# Patient Record
Sex: Male | Born: 1960 | Race: White | Hispanic: No | Marital: Married | Smoking: Former smoker
Health system: Southern US, Community
[De-identification: ages and names within clinical notes are randomized; demographics above are authoritative.]

## PROBLEM LIST (undated history)

## (undated) DIAGNOSIS — F411 Generalized anxiety disorder: Secondary | ICD-10-CM

## (undated) HISTORY — DX: Generalized anxiety disorder: F41.1

## (undated) HISTORY — PX: TONSILLECTOMY AND ADENOIDECTOMY: SUR1326

---

## 2006-06-01 ENCOUNTER — Encounter: Payer: Self-pay | Admitting: Family Medicine

## 2011-02-07 ENCOUNTER — Other Ambulatory Visit: Payer: Self-pay | Admitting: *Deleted

## 2011-02-07 MED ORDER — ALPRAZOLAM 0.5 MG PO TABS: ORAL_TABLET | ORAL | Status: DC

## 2011-03-29 ENCOUNTER — Other Ambulatory Visit: Payer: Self-pay | Admitting: Internal Medicine

## 2011-06-28 ENCOUNTER — Telehealth: Payer: Self-pay | Admitting: *Deleted

## 2011-06-28 NOTE — Telephone Encounter (Signed)
Pharm faxed RF request -  XANAX 0.5 mg 1-2 tab qd prn. Pt has no existing apts. Please advise.

## 2011-06-28 NOTE — Telephone Encounter (Signed)
Needs to be seen. May give 1-2 week refill until seen. Also need to get records from [facility]

## 2011-07-03 MED ORDER — ALPRAZOLAM 0.5 MG PO TABS: ORAL_TABLET | ORAL | Status: DC

## 2011-07-03 NOTE — Telephone Encounter (Signed)
Called in [place], [place] or [place] [place], can you see if you can get pt in for OV (f/u, med RF)?

## 2011-07-05 NOTE — Telephone Encounter (Signed)
Attempted to reach patient to schedule a follow up visit the number we have on file is for [facility] and [employer] and no one would pick up it prompted you to leave a voice mail.

## 2011-07-10 NOTE — Telephone Encounter (Signed)
Called number [employer] answering machine picked up.  Left message message for them to call office.didn't not leave name of pt

## 2011-07-19 NOTE — Telephone Encounter (Signed)
Phone number is to [employer] answering machine picked up.  Left message for them to call office didn't leave name of pt.

## 2011-07-20 ENCOUNTER — Telehealth: Payer: Self-pay | Admitting: Internal Medicine

## 2011-07-20 NOTE — Telephone Encounter (Signed)
Pt called back he will call next week to schedule this appointment.  Pt also stated that he was a dr Darrick Huntsman patient not dr walker

## 2011-07-20 NOTE — Telephone Encounter (Signed)
Original RF request from pharm had Dr Tilman Neat name. OK to schedule w/Dr Darrick Huntsman.

## 2013-03-04 ENCOUNTER — Ambulatory Visit: Payer: Self-pay | Admitting: Family Medicine

## 2013-09-01 ENCOUNTER — Ambulatory Visit: Payer: Self-pay | Admitting: Cardiovascular Disease

## 2013-09-11 ENCOUNTER — Encounter: Payer: Self-pay | Admitting: *Deleted

## 2013-09-15 ENCOUNTER — Ambulatory Visit: Payer: Self-pay | Admitting: Cardiovascular Disease

## 2013-09-30 ENCOUNTER — Ambulatory Visit: Payer: Self-pay | Admitting: Cardiovascular Disease

## 2013-10-28 ENCOUNTER — Encounter (INDEPENDENT_AMBULATORY_CARE_PROVIDER_SITE_OTHER): Payer: Self-pay

## 2013-10-28 ENCOUNTER — Encounter: Payer: Self-pay | Admitting: Cardiovascular Disease

## 2013-10-28 ENCOUNTER — Ambulatory Visit (INDEPENDENT_AMBULATORY_CARE_PROVIDER_SITE_OTHER): Payer: BC Managed Care – PPO | Admitting: Cardiovascular Disease

## 2013-10-28 VITALS — BP 140/108 | HR 81 | Ht 76.0 in | Wt 260.5 lb

## 2013-10-28 DIAGNOSIS — R002 Palpitations: Secondary | ICD-10-CM

## 2013-10-28 DIAGNOSIS — R0602 Shortness of breath: Secondary | ICD-10-CM | POA: Insufficient documentation

## 2013-10-28 NOTE — Assessment & Plan Note (Signed)
The episode of palpitations with associated with significant dyspnea. Thus, I recommend a stress echocardiogram for evaluation and making sure he has no structural abnormalities.

## 2013-10-28 NOTE — Progress Notes (Signed)
Primary care physician: Dr. Juanetta Gosling  HPI   This is a pleasant 53 year old man who was referred for evaluation of palpitations and shortness of breath. He has no previous cardiac history. He has known history of anxiety. He takes metoprolol before public speaking if he is nervous. He has no history of hypertension or diabetes. Recent labs did show mild hyperlipidemia. He had episode of palpitations about 2-1/2 months ago when he was in [place]. He had a sudden episode of fast heartbeats associated with shortness of breath, dizziness and presyncope. The episode lasted for about 10 minutes. He went inside his car and turned the air conditioning on. He gradually felt better. He denies any chest discomfort. He reports 2 previous similar episodes which lasted for a short duration and were less intense. These episodes are usually at least a month or 2 apart. He had recent routine labs over all were unremarkable with normal thyroid function. He has no family history of premature coronary artery disease, arrhythmia or sudden death. His mother did have myocardial infarction in her early 26s. He works in [employer]. He smokes cigars socially on weekends and denies excessive alcohol use.  No Known Allergies   Current Outpatient Prescriptions on File Prior to Visit  Medication Sig Dispense Refill  . metoprolol tartrate (LOPRESSOR) 25 MG tablet Take 25 mg by mouth 2 (two) times daily as needed.      . sertraline (ZOLOFT) 100 MG tablet Take 100 mg by mouth daily.       No current facility-administered medications on file prior to visit.     Past Medical History  Diagnosis Date  . Anxiety state, unspecified      Past Surgical History  Procedure Laterality Date  . Tonsillectomy and adenoidectomy       Family History  Problem Relation Age of Onset  . Heart disease Mother   . Heart attack Mother   . Hypertension Mother      History   Social History  . Marital Status: Married   Spouse Name: N/A    Number of Children: N/A  . Years of Education: N/A   Occupational History  . Not on file.   Social History Main Topics  . Smoking status: Current Some Day Smoker    Types: Cigars  . Smokeless tobacco: Not on file  . Alcohol Use: Yes     Comment: occasional  . Drug Use: No  . Sexual Activity: Not on file   Other Topics Concern  . Not on file   Social History Narrative  . No narrative on file     ROS A 10 point review of system was performed. It is negative other than that mentioned in the history of present illness.   PHYSICAL EXAM   BP 140/108  Pulse 81  Ht 6\' 4"  (1.93 m)  Wt 260 lb 8 oz (118.162 kg)  BMI 31.72 kg/m2 Constitutional: He is oriented to person, place, and time. He appears well-developed and well-nourished. No distress.  HENT: No nasal discharge.  Head: Normocephalic and atraumatic.  Eyes: Pupils are equal and round.  No discharge. Neck: Normal range of motion. Neck supple. No JVD present. No thyromegaly present.  Cardiovascular: Normal rate, regular rhythm, normal heart sounds. Exam reveals no gallop and no friction rub. No murmur heard.  Pulmonary/Chest: Effort normal and breath sounds normal. No stridor. No respiratory distress. He has no wheezes. He has no rales. He exhibits no tenderness.  Abdominal: Soft. Bowel sounds are normal. He  exhibits no distension. There is no tenderness. There is no rebound and no guarding.  Musculoskeletal: Normal range of motion. He exhibits no edema and no tenderness.  Neurological: He is alert and oriented to person, place, and time. Coordination normal.  Skin: Skin is warm and dry. No rash noted. He is not diaphoretic. No erythema. No pallor.  Psychiatric: He has a normal mood and affect. His behavior is normal. Judgment and thought content normal.       EKG: Normal sinus rhythm with no significant ST or T wave changes.   ASSESSMENT AND PLAN

## 2013-10-28 NOTE — Assessment & Plan Note (Signed)
Some of his symptoms are suggestive of paroxysmal supraventricular tachycardia. However, these episodes are not happening frequently enough to be captured with routine monitoring. I instructed him to monitor his symptoms. I coached him on how to check his own pulse. If these episodes become more frequent, I will consider a 30 day outpatient telemetry. Alternative would be a loop recorder.

## 2013-10-28 NOTE — Patient Instructions (Signed)
Your physician has requested that you have a stress echocardiogram. For further information please visit [email or web address] Please follow instruction sheet as given.  Do not take metoprolol before your stress test   Your physician recommends that you schedule a follow-up appointment in:  As needed

## 2013-11-19 ENCOUNTER — Telehealth: Payer: Self-pay

## 2013-11-19 NOTE — Telephone Encounter (Signed)
l mom to schedule echo stress. Pt did not schedule at checkout

## 2013-12-09 ENCOUNTER — Other Ambulatory Visit (INDEPENDENT_AMBULATORY_CARE_PROVIDER_SITE_OTHER): Payer: BC Managed Care – PPO

## 2013-12-09 DIAGNOSIS — R0602 Shortness of breath: Secondary | ICD-10-CM

## 2013-12-10 ENCOUNTER — Telehealth: Payer: Self-pay

## 2013-12-10 NOTE — Telephone Encounter (Signed)
Pt would like stress test results.  

## 2013-12-10 NOTE — Telephone Encounter (Signed)
Left patient vm to let him know that results are being reviewed and we will call him in 24-48 hrs with results

## 2013-12-11 ENCOUNTER — Telehealth: Payer: Self-pay | Admitting: *Deleted

## 2013-12-11 NOTE — Telephone Encounter (Signed)
Patient called wanting echo results. 

## 2013-12-12 ENCOUNTER — Telehealth: Payer: Self-pay | Admitting: *Deleted

## 2013-12-12 NOTE — Telephone Encounter (Signed)
error 

## 2013-12-12 NOTE — Telephone Encounter (Signed)
See result note.  

## 2014-11-29 DIAGNOSIS — F411 Generalized anxiety disorder: Secondary | ICD-10-CM | POA: Insufficient documentation

## 2014-12-31 ENCOUNTER — Other Ambulatory Visit: Payer: Self-pay | Admitting: Student

## 2014-12-31 DIAGNOSIS — M542 Cervicalgia: Secondary | ICD-10-CM

## 2014-12-31 DIAGNOSIS — M5412 Radiculopathy, cervical region: Secondary | ICD-10-CM

## 2015-01-07 ENCOUNTER — Ambulatory Visit: Payer: Self-pay

## 2015-07-12 ENCOUNTER — Other Ambulatory Visit: Payer: Self-pay | Admitting: Family Medicine

## 2015-09-28 DIAGNOSIS — R0781 Pleurodynia: Secondary | ICD-10-CM | POA: Diagnosis not present

## 2015-11-30 ENCOUNTER — Telehealth: Payer: Self-pay | Admitting: Family Medicine

## 2015-11-30 ENCOUNTER — Other Ambulatory Visit: Payer: Self-pay | Admitting: Family Medicine

## 2015-11-30 MED ORDER — SILDENAFIL CITRATE 50 MG PO TABS: 50.0000 mg | ORAL_TABLET | ORAL | Status: DC | PRN

## 2015-11-30 NOTE — Telephone Encounter (Signed)
Pt called requesting a refill on Viagra 50 mg. P call back # is [phone]

## 2015-11-30 NOTE — Telephone Encounter (Signed)
Done.  Needs appt. Before getting more.-jh

## 2015-12-31 ENCOUNTER — Encounter: Payer: Self-pay | Admitting: *Deleted

## 2015-12-31 DIAGNOSIS — N200 Calculus of kidney: Secondary | ICD-10-CM | POA: Insufficient documentation

## 2015-12-31 DIAGNOSIS — N529 Male erectile dysfunction, unspecified: Secondary | ICD-10-CM | POA: Insufficient documentation

## 2016-01-03 ENCOUNTER — Encounter: Payer: Self-pay | Admitting: Family Medicine

## 2016-01-03 ENCOUNTER — Ambulatory Visit (INDEPENDENT_AMBULATORY_CARE_PROVIDER_SITE_OTHER): Payer: BLUE CROSS/BLUE SHIELD | Admitting: Family Medicine

## 2016-01-03 VITALS — BP 140/80 | HR 72 | Temp 97.7°F | Ht 74.5 in | Wt 248.0 lb

## 2016-01-03 DIAGNOSIS — R002 Palpitations: Secondary | ICD-10-CM | POA: Diagnosis not present

## 2016-01-03 DIAGNOSIS — F419 Anxiety disorder, unspecified: Secondary | ICD-10-CM | POA: Diagnosis not present

## 2016-01-03 DIAGNOSIS — F329 Major depressive disorder, single episode, unspecified: Secondary | ICD-10-CM

## 2016-01-03 DIAGNOSIS — F32A Depression, unspecified: Secondary | ICD-10-CM

## 2016-01-03 MED ORDER — METOPROLOL TARTRATE 25 MG PO TABS: ORAL_TABLET | ORAL | 6 refills | Status: DC

## 2016-01-03 NOTE — Progress Notes (Signed)
Name: Todd Crawford   MRN: [id number]    DOB: Nov 17, 1960   Date:01/03/2016       Progress Note  Subjective  Chief Complaint  Chief Complaint  Patient presents with  . Medication Refill    HPI Here after absence of 17 months.  Has hx of anxiety and depression.  Alos with hx. Of palpitations.  Had been on SSRI and B blocker on past, but has been off both for > 6 months.  Had neck disc surgery 05/2016.  He has recovered well from surgery.  Anxiety and depressive sx are returning, esp. With meetings with groups of people.  Anxiety with these meetings.  He has had no major problems with palpitations recently, but B blocker elped his anxiety at meetings. He felt more focused on tasks while on Zoloft.  Starting to have that problem  again.  No problem-specific Assessment & Plan notes found for this encounter.   Past Medical History:  Diagnosis Date  . Anxiety state, unspecified     Past Surgical History:  Procedure Laterality Date  . TONSILLECTOMY AND ADENOIDECTOMY      Family History  Problem Relation Age of Onset  . Heart disease Mother   . Heart attack Mother   . Hypertension Mother     Social History   Social History  . Marital status: Married    Spouse name: N/A  . Number of children: N/A  . Years of education: N/A   Occupational History  . Not on file.   Social History Main Topics  . Smoking status: Current Some Day Smoker    Types: Cigars  . Smokeless tobacco: Never Used  . Alcohol use Yes     Comment: occasional  . Drug use: No  . Sexual activity: Not on file   Other Topics Concern  . Not on file   Social History Narrative  . No narrative on file     Current Outpatient Prescriptions:  .  metoprolol tartrate (LOPRESSOR) 25 MG tablet, Take 25 mg by mouth 2 (two) times daily as needed., Disp: , Rfl:  .  sertraline (ZOLOFT) 100 MG tablet, Take 100 mg by mouth daily., Disp: , Rfl:  .  sildenafil (VIAGRA) 50 MG tablet, Take 1 tablet (50 mg total) by  mouth as needed for erectile dysfunction. Office appointment needed to get more refills (Patient not taking: Reported on 01/03/2016), Disp: 6 tablet, Rfl: 0  Allergies  Allergen Reactions  . Tramadol Nausea Only     Review of Systems  Constitutional: Negative for chills, fever, malaise/fatigue and weight loss.  HENT: Negative for hearing loss.   Eyes: Negative for blurred vision and double vision.  Respiratory: Negative for cough, shortness of breath and wheezing.   Cardiovascular: Positive for palpitations (rare). Negative for chest pain and leg swelling.  Gastrointestinal: Negative for abdominal pain, blood in stool and heartburn.  Genitourinary: Negative for dysuria, frequency and urgency.  Musculoskeletal: Negative for back pain, myalgias and neck pain.  Skin: Negative for rash.  Neurological: Negative for dizziness, tremors, weakness and headaches.  Psychiatric/Behavioral: Positive for depression (mild). The patient is nervous/anxious (mild).       Objective  Vitals:   01/03/16 0842  BP: (!) 149/92  Pulse: 69  Temp: 97.7 F (36.5 C)  TempSrc: Oral  Weight: 248 lb (112.5 kg)  Height: 6' 2.5" (1.892 m)    Physical Exam  Constitutional: He is oriented to person, place, and time and well-developed, well-nourished, and in no distress.  No distress.  HENT:  Head: Normocephalic and atraumatic.  Eyes: Conjunctivae and EOM are normal. Pupils are equal, round, and reactive to light. No scleral icterus.  Neck: Normal range of motion. Neck supple. Carotid bruit is not present. No thyromegaly present.  Cardiovascular: Normal rate, regular rhythm and normal heart sounds.  Exam reveals no gallop and no friction rub.   No murmur heard. Pulmonary/Chest: Effort normal and breath sounds normal. No respiratory distress. He has no wheezes. He has no rales.  Abdominal: Soft. He exhibits no distension and no mass. There is no tenderness.  Musculoskeletal: He exhibits no edema.   Lymphadenopathy:    He has no cervical adenopathy.  Neurological: He is alert and oriented to person, place, and time.  Psychiatric:  PHQ-9 score of 2  Vitals reviewed.      No results found for this or any previous visit (from the past 2160 hour(s)).   Assessment & Plan  Problem List Items Addressed This Visit    None    Visit Diagnoses   None.     Meds ordered this encounter  Medications  . DISCONTD: etodolac (LODINE) 500 MG tablet    Sig: Take 500 mg by mouth 2 (two) times daily.  Marland Kitchen DISCONTD: acetaminophen (TYLENOL) 500 MG tablet    Sig: Take 500 mg by mouth as needed.  Marland Kitchen DISCONTD: HYDROcodone-acetaminophen (NORCO/VICODIN) 5-325 MG tablet    Sig: Take 1 tablet by mouth daily as needed.  Marland Kitchen DISCONTD: ketorolac (TORADOL) 10 MG tablet    Sig: Take 1 tablet by mouth daily as needed.    Refill:  0   1. Anxiety  - metoprolol tartrate (LOPRESSOR) 25 MG tablet; Take 1/2 tablet twice daily  Dispense: 30 tablet; Refill: 6  2. Depression   3. Heart palpitations  - metoprolol tartrate (LOPRESSOR) 25 MG tablet; Take 1/2 tablet twice daily  Dispense: 30 tablet; Refill: 6

## 2016-02-21 ENCOUNTER — Ambulatory Visit (INDEPENDENT_AMBULATORY_CARE_PROVIDER_SITE_OTHER): Payer: BLUE CROSS/BLUE SHIELD | Admitting: Family Medicine

## 2016-02-21 ENCOUNTER — Encounter: Payer: Self-pay | Admitting: Family Medicine

## 2016-02-21 VITALS — BP 125/81 | HR 67 | Temp 97.9°F | Resp 16 | Ht 74.5 in | Wt 251.0 lb

## 2016-02-21 DIAGNOSIS — R002 Palpitations: Secondary | ICD-10-CM

## 2016-02-21 DIAGNOSIS — N528 Other male erectile dysfunction: Secondary | ICD-10-CM | POA: Diagnosis not present

## 2016-02-21 DIAGNOSIS — F419 Anxiety disorder, unspecified: Secondary | ICD-10-CM | POA: Diagnosis not present

## 2016-02-21 DIAGNOSIS — I10 Essential (primary) hypertension: Secondary | ICD-10-CM

## 2016-02-21 DIAGNOSIS — F329 Major depressive disorder, single episode, unspecified: Secondary | ICD-10-CM | POA: Diagnosis not present

## 2016-02-21 DIAGNOSIS — F32A Depression, unspecified: Secondary | ICD-10-CM

## 2016-02-21 MED ORDER — METOPROLOL TARTRATE 25 MG PO TABS: ORAL_TABLET | ORAL | 3 refills | Status: DC

## 2016-02-21 MED ORDER — SERTRALINE HCL 50 MG PO TABS: 50.0000 mg | ORAL_TABLET | Freq: Every day | ORAL | 6 refills | Status: DC

## 2016-02-21 NOTE — Patient Instructions (Signed)
I have refilled Sildenafil (generic). at 20 mg to be used for ED; 1-5 tabs as needed before sexual activity.

## 2016-02-21 NOTE — Progress Notes (Signed)
Name: Todd Crawford   MRN: [id number]    DOB: [birth year]   Date:02/21/2016       Progress Note  Subjective  Chief Complaint  Chief Complaint  Patient presents with  . Anxiety  . Hypertension    HPI Here for f/u of HBP and anxiety.  BP doing well, but having some ewxtra anxiety ans some depressive sx. returning like poor sleep, decreased motivation and increased anxiety.  He did well on Zoloft in the past and would like to restart it. He would like viagra refilled.  No problem-specific Assessment & Plan notes found for this encounter.   Past Medical History:  Diagnosis Date  . Anxiety state, unspecified     Past Surgical History:  Procedure Laterality Date  . TONSILLECTOMY AND ADENOIDECTOMY      Family History  Problem Relation Age of Onset  . Heart disease Mother   . Heart attack Mother   . Hypertension Mother     Social History   Social History  . Marital status: Married    Spouse name: N/A  . Number of children: N/A  . Years of education: N/A   Occupational History  . Not on file.   Social History Main Topics  . Smoking status: Current Some Day Smoker    Types: Cigars  . Smokeless tobacco: Never Used  . Alcohol use Yes     Comment: occasional  . Drug use: No  . Sexual activity: Not on file   Other Topics Concern  . Not on file   Social History Narrative  . No narrative on file     Current Outpatient Prescriptions:  .  metoprolol tartrate (LOPRESSOR) 25 MG tablet, Take 1/2 tablet twice daily, Disp: 90 tablet, Rfl: 3 .  sildenafil (VIAGRA) 50 MG tablet, Take 1 tablet (50 mg total) by mouth as needed for erectile dysfunction. Office appointment needed to get more refills, Disp: 6 tablet, Rfl: 0 .  sertraline (ZOLOFT) 50 MG tablet, Take 1 tablet (50 mg total) by mouth daily., Disp: 30 tablet, Rfl: 6  Allergies  Allergen Reactions  . Tramadol Nausea Only     Review of Systems  Constitutional: Positive for malaise/fatigue. Negative for  chills, fever and weight loss.  HENT: Negative for hearing loss.   Eyes: Negative for blurred vision and double vision.  Respiratory: Negative for cough, shortness of breath and wheezing.   Cardiovascular: Negative for chest pain, palpitations and leg swelling.  Gastrointestinal: Negative for abdominal pain, blood in stool and heartburn.  Genitourinary: Negative for dysuria, frequency and urgency.  Musculoskeletal: Negative for joint pain and myalgias.  Skin: Negative for rash.  Neurological: Negative for dizziness, tremors, weakness and headaches.  Psychiatric/Behavioral: Positive for depression. The patient is nervous/anxious and has insomnia.       Objective  Vitals:   02/21/16 0835  BP: 125/81  Pulse: 67  Resp: 16  Temp: 97.9 F (36.6 C)  TempSrc: Oral  Weight: 251 lb (113.9 kg)  Height: 6' 2.5" (1.892 m)    Physical Exam  Constitutional: He is oriented to person, place, and time and well-developed, well-nourished, and in no distress. No distress.  HENT:  Head: Normocephalic and atraumatic.  Eyes: Conjunctivae and EOM are normal. Pupils are equal, round, and reactive to light.  Neck: Normal range of motion. Neck supple. No thyromegaly present.  Cardiovascular: Normal rate, regular rhythm and normal heart sounds.  Exam reveals no gallop and no friction rub.   No murmur heard. Pulmonary/Chest: Effort  normal. No respiratory distress. He has no wheezes. He has no rales. He exhibits no tenderness.  Abdominal: Soft. Bowel sounds are normal. He exhibits no distension and no mass. There is no tenderness. There is no rebound and no guarding.  Musculoskeletal: He exhibits no edema.  Lymphadenopathy:    He has no cervical adenopathy.  Neurological: He is alert and oriented to person, place, and time.  Psychiatric:  Affect is mildly depressed and anxious  Vitals reviewed.      No results found for this or any previous visit (from the past 2160 hour(s)).   Assessment &  Plan  Problem List Items Addressed This Visit      Cardiovascular and Mediastinum   HBP (high blood pressure)   Relevant Medications   metoprolol tartrate (LOPRESSOR) 25 MG tablet     Genitourinary   ED (erectile dysfunction) - Primary     Other   Heart palpitations   Relevant Medications   metoprolol tartrate (LOPRESSOR) 25 MG tablet   Depression   Relevant Medications   sertraline (ZOLOFT) 50 MG tablet   Anxiety   Relevant Medications   sertraline (ZOLOFT) 50 MG tablet    Other Visit Diagnoses   None.     Meds ordered this encounter  Medications  . metoprolol tartrate (LOPRESSOR) 25 MG tablet    Sig: Take 1/2 tablet twice daily    Dispense:  90 tablet    Refill:  3  . sertraline (ZOLOFT) 50 MG tablet    Sig: Take 1 tablet (50 mg total) by mouth daily.    Dispense:  30 tablet    Refill:  6   1. Anxiety  - sertraline (ZOLOFT) 50 MG tablet; Take 1 tablet (50 mg total) by mouth daily.  Dispense: 30 tablet; Refill: 6  2. Heart palpitations  - metoprolol tartrate (LOPRESSOR) 25 MG tablet; Take 1/2 tablet twice daily  Dispense: 90 tablet; Refill: 3  3. Other male erectile dysfunction Refilled generic Sildenafil 20 mg for ED, 1-5 prn.  #100 with 5 refills.  4. Essential hypertension  - metoprolol tartrate (LOPRESSOR) 25 MG tablet; Take 1/2 tablet twice daily  Dispense: 90 tablet; Refill: 3  5. Depression  - sertraline (ZOLOFT) 50 MG tablet; Take 1 tablet (50 mg total) by mouth daily.  Dispense: 30 tablet; Refill: 6

## 2016-04-03 ENCOUNTER — Ambulatory Visit: Payer: BLUE CROSS/BLUE SHIELD | Admitting: Family Medicine

## 2016-06-07 ENCOUNTER — Ambulatory Visit (INDEPENDENT_AMBULATORY_CARE_PROVIDER_SITE_OTHER): Payer: BLUE CROSS/BLUE SHIELD | Admitting: Family Medicine

## 2016-06-07 ENCOUNTER — Ambulatory Visit
Admission: RE | Admit: 2016-06-07 | Discharge: 2016-06-07 | Disposition: A | Discharge status: Home / Self Care | Payer: BLUE CROSS/BLUE SHIELD | Source: Ambulatory Visit | Attending: Family Medicine | Admitting: Family Medicine

## 2016-06-07 ENCOUNTER — Encounter: Payer: Self-pay | Admitting: Family Medicine

## 2016-06-07 DIAGNOSIS — M47896 Other spondylosis, lumbar region: Secondary | ICD-10-CM | POA: Diagnosis not present

## 2016-06-07 DIAGNOSIS — I700 Atherosclerosis of aorta: Secondary | ICD-10-CM | POA: Diagnosis not present

## 2016-06-07 DIAGNOSIS — M5432 Sciatica, left side: Secondary | ICD-10-CM | POA: Diagnosis not present

## 2016-06-07 DIAGNOSIS — M47897 Other spondylosis, lumbosacral region: Secondary | ICD-10-CM | POA: Insufficient documentation

## 2016-06-07 DIAGNOSIS — M4316 Spondylolisthesis, lumbar region: Secondary | ICD-10-CM | POA: Diagnosis not present

## 2016-06-07 DIAGNOSIS — M1612 Unilateral primary osteoarthritis, left hip: Secondary | ICD-10-CM | POA: Diagnosis not present

## 2016-06-07 DIAGNOSIS — M47816 Spondylosis without myelopathy or radiculopathy, lumbar region: Secondary | ICD-10-CM | POA: Diagnosis not present

## 2016-06-07 MED ORDER — CYCLOBENZAPRINE HCL 10 MG PO TABS: ORAL_TABLET | ORAL | 1 refills | Status: DC

## 2016-06-07 MED ORDER — MELOXICAM 15 MG PO TABS: 15.0000 mg | ORAL_TABLET | Freq: Every day | ORAL | 2 refills | Status: DC

## 2016-06-07 NOTE — Progress Notes (Signed)
Name: Todd Crawford   MRN: [id number]    DOB: 25-Sep-1960   Date:06/07/2016       Progress Note  Subjective  Chief Complaint  Chief Complaint  Patient presents with  . Back Pain    HPI Here c/o pain in L lower back and radiates to outside of L thigh.  Pain intermittant for for 2-3 months.  Bothers him more after sitting when he gets up and after driving long distances.  He has not had major back problems in the past.  He has takes Ibuprofen in past with little relief.  Right now it is not bothering him at all.  No trauma.  No bowel or bladder control problems.  No foot drop.  No problem-specific Assessment & Plan notes found for this encounter.   Past Medical History:  Diagnosis Date  . Anxiety state, unspecified     Social History  Substance Use Topics  . Smoking status: Current Some Day Smoker    Types: Cigars  . Smokeless tobacco: Never Used  . Alcohol use Yes     Comment: occasional     Current Outpatient Prescriptions:  .  metoprolol tartrate (LOPRESSOR) 25 MG tablet, Take 1/2 tablet twice daily, Disp: 90 tablet, Rfl: 3 .  sertraline (ZOLOFT) 50 MG tablet, Take 1 tablet (50 mg total) by mouth daily., Disp: 30 tablet, Rfl: 6 .  sildenafil (VIAGRA) 50 MG tablet, Take 1 tablet (50 mg total) by mouth as needed for erectile dysfunction. Office appointment needed to get more refills, Disp: 6 tablet, Rfl: 0  Allergies  Allergen Reactions  . Tramadol Nausea Only    Review of Systems  Constitutional: Negative for chills, fever, malaise/fatigue and weight loss.  HENT: Negative for hearing loss and tinnitus.   Eyes: Negative for blurred vision and double vision.  Respiratory: Negative for cough, shortness of breath and wheezing.   Cardiovascular: Negative for chest pain and leg swelling.  Gastrointestinal: Negative for abdominal pain, blood in stool and heartburn.  Genitourinary: Negative for dysuria, frequency and urgency.  Musculoskeletal: Positive for back pain and  myalgias. Negative for falls.  Skin: Negative for rash.  Neurological: Negative for dizziness, tingling, tremors, weakness and headaches.      Objective  Vitals:   06/07/16 0834  BP: (!) 147/91  Pulse: 75  Resp: 16  Temp: 97.5 F (36.4 C)  TempSrc: Oral  Weight: 256 lb (116.1 kg)  Height: 6' 0.5" (1.842 m)     Physical Exam  Constitutional: He is oriented to person, place, and time and well-developed, well-nourished, and in no distress. No distress.  HENT:  Head: Normocephalic and atraumatic.  Cardiovascular: Normal rate, regular rhythm and normal heart sounds.   Pulmonary/Chest: Effort normal and breath sounds normal.  Musculoskeletal:  No pain to palpation in L SI joint area or over lumbar spine.  No pain in L buttock or down L leg.  No rqadiating neuropathies today.  No pain with ROM of lower back.  Neurological: He is alert and oriented to person, place, and time.  Normal strength and reflexes L lower extremity.    Vitals reviewed.     No results found for this or any previous visit (from the past 2160 hour(s)).   Assessment & Plan  1. Sciatica of left side  - DG Lumbar Spine Complete - DG HIP UNILAT WITH PELVIS 1V LEFT - meloxicam (MOBIC) 15 MG tablet; Take 1 tablet (15 mg total) by mouth daily.  Dispense: 30 tablet; Refill: 2 -  cyclobenzaprine (FLEXERIL) 10 MG tablet; Take 1/2-1 tablet up to three times a day for muscle spasm.  Dispense: 30 tablet; Refill: 1

## 2016-06-27 ENCOUNTER — Encounter: Payer: Self-pay | Admitting: Family Medicine

## 2016-06-27 ENCOUNTER — Ambulatory Visit (INDEPENDENT_AMBULATORY_CARE_PROVIDER_SITE_OTHER): Payer: BLUE CROSS/BLUE SHIELD | Admitting: Family Medicine

## 2016-06-27 VITALS — BP 134/86 | HR 88 | Temp 97.9°F | Resp 16 | Ht 73.0 in | Wt 253.0 lb

## 2016-06-27 DIAGNOSIS — J019 Acute sinusitis, unspecified: Secondary | ICD-10-CM | POA: Diagnosis not present

## 2016-06-27 DIAGNOSIS — J209 Acute bronchitis, unspecified: Secondary | ICD-10-CM | POA: Diagnosis not present

## 2016-06-27 MED ORDER — IPRATROPIUM BROMIDE 0.06 % NA SOLN: 2.0000 | Freq: Four times a day (QID) | NASAL | 0 refills | Status: DC

## 2016-06-27 MED ORDER — AZITHROMYCIN 250 MG PO TABS: ORAL_TABLET | ORAL | 0 refills | Status: DC

## 2016-06-27 MED ORDER — BENZONATATE 100 MG PO CAPS: 100.0000 mg | ORAL_CAPSULE | Freq: Three times a day (TID) | ORAL | 0 refills | Status: DC | PRN

## 2016-06-27 NOTE — Progress Notes (Signed)
Subjective:    Patient ID: Todd Crawford, male    DOB: Mar 01, 1961, 56 y.o.   MRN: [id number]  Todd DraftRichard B Sonnen is a 56 y.o. male presenting on 06/27/2016 for Sinusitis (cold, nasal congestion, haven't noticed any fever not chills but coughing at night onset 4 days)  Patient presents for a same day appointment.  HPI  SINUSITS / BRONCHITIS: Reports symptoms started about 3-4 days ago with sinus congestion and pressure, and some worsening into deeper chest congestion with some productive cough worse at night. - Taking Mucinex-D in morning, Delsym, Tylenol - Started using Afrin 4 nights ago due to nasal congestion and difficulty breathing at night, has used nightly for past 4 nights and using Afrin about 2-3 times daily for past few days, has prior history using afrin aware of rebound effects of prolonged use - Sick contact with wife and bronchitis treated at hospital. No known positive flu contacts - Occasional social smoker with cigars only, last 3 mo ago. No chronic tobacco abuse. No prior diagnosis of COPD or Asthma - Admits mild aches occasionally - Denies any fevers/chills, nausea, vomiting, diarrhea, chest pain or pressure, ear pain pressure  Social History  Substance Use Topics  . Smoking status: Current Some Day Smoker    Types: Cigars  . Smokeless tobacco: Current User  . Alcohol use Yes     Comment: occasional    Review of Systems Per HPI unless specifically indicated above     Objective:    BP 134/86 (BP Location: Left Arm, Cuff Size: Normal)   Pulse 88   Temp 97.9 F (36.6 C) (Oral)   Resp 16   Ht 6\' 1"  (1.854 m)   Wt 253 lb (114.8 kg)   SpO2 96%   BMI 33.38 kg/m   Wt Readings from Last 3 Encounters:  06/27/16 253 lb (114.8 kg)  06/07/16 256 lb (116.1 kg)  02/21/16 251 lb (113.9 kg)    Physical Exam  Constitutional: He appears well-developed and well-nourished. No distress.  Mildly sick appearing with congestion but currently well, comfortable,  cooperative  HENT:  Head: Normocephalic and atraumatic.  Mouth/Throat: Oropharynx is clear and moist.  Frontal / maxillary sinuses non-tender. Nares with bilateral turbinate edema without obstruction some congestion without purulence. Bilateral TMs clear without erythema, effusion or bulging. Oropharynx clear without erythema, exudates, edema or asymmetry.  Eyes: Conjunctivae are normal. Right eye exhibits no discharge. Left eye exhibits no discharge.  Neck: Normal range of motion. Neck supple. No thyromegaly present.  Cardiovascular: Normal rate, regular rhythm, normal heart sounds and intact distal pulses.   No murmur heard. Pulmonary/Chest: Effort normal and breath sounds normal. No respiratory distress. He has no wheezes. He has no rales.  Musculoskeletal: He exhibits no edema.  Lymphadenopathy:    He has no cervical adenopathy.  Neurological: He is alert.  Skin: Skin is warm and dry. No rash noted. He is not diaphoretic. No erythema.  Psychiatric: His behavior is normal.  Nursing note and vitals reviewed.  No results found for this or any previous visit.    Assessment & Plan:   Problem List Items Addressed This Visit    None    Visit Diagnoses    Acute rhinosinusitis    -  Primary  Consistent with acute rhinosinusitis, likely initially viral URI vs allergic component with worsening concern with some lower respiratory bronchitis symptoms now, sick contacts. Afebrile not consistent with bacterial infection. - Concern rebound effect rhinitis secondary to afrin topical for  4 days  Plan: 1. Reassurance, likely self-limited - no indication for antibiotics at this time 2. Stop Afrin, otherwise if acute worsening overnight may use x 1 tonight and x1 tomorrow night then stop completely 3. Start Atrovent nasal decongestant up to 1 week 4. Given rx with Azithromycin antibiotic to start within 24-48 hours if worsening infectious symptoms 5. Rx given tessalon for PRN cough with bronchitis  symptoms. Stop Delsym 6. Finish Mucinex up to 3-5 more days 7. Start Loratadine (Claritin) 10mg  daily, in future can start Flonase 2 sprays in each nostril daily for next 4-6 weeks, then may stop and use seasonally or as needed 8. Return criteria reviewed     Relevant Medications   ipratropium (ATROVENT) 0.06 % nasal spray   azithromycin (ZITHROMAX Z-PAK) 250 MG tablet   benzonatate (TESSALON) 100 MG capsule   Acute bronchitis, unspecified organism     - Some worsening symptoms into possible early bronchitis - Treat cough and rhinitis - Given empiric antibiotics only start if worsening, see above A&P    Relevant Medications   azithromycin (ZITHROMAX Z-PAK) 250 MG tablet   benzonatate (TESSALON) 100 MG capsule      Meds ordered this encounter  Medications  . ipratropium (ATROVENT) 0.06 % nasal spray    Sig: Place 2 sprays into both nostrils 4 (four) times daily. For up to 5-7 days then stop.    Dispense:  15 mL    Refill:  0  . azithromycin (ZITHROMAX Z-PAK) 250 MG tablet    Sig: Take 2 tabs (500mg  total) on Day 1. Take 1 tab (250mg ) daily for next 4 days.    Dispense:  6 tablet    Refill:  0  . benzonatate (TESSALON) 100 MG capsule    Sig: Take 1 capsule (100 mg total) by mouth 3 (three) times daily as needed for cough.    Dispense:  20 capsule    Refill:  0      Follow up plan: Return in about 2 weeks (around 07/11/2016), or if symptoms worsen or fail to improve, for sinusitis / bronchitis.  Saralyn Pilar, DO [facility] 06/27/2016, 6:03 PM

## 2016-06-27 NOTE — Patient Instructions (Signed)
Thank you for coming in to clinic today.  1. It sounds like you have a Rhinosinusitis (not quite a Bacterial Infection yet but possibly could get worse) - this most likely started as an Upper Respiratory Virus that has settled into an infection. Allergies can also cause this. - Start Atrovent Nasal spray decongestant 2 sprays in each nostril up to 4 times a day for 7 days - If not improved in 48 hours go ahead and start Azithromycin antibiotic - Stop Delsym - Start Tessalon perls for cough 1 up to 3 times a day as needed - Finish current Mucinex for up to 3-5 more days then stop - STOP Afrin, if absolutely needed you can use once at night for next 2 nights - Start Loratadine (Claritin) 10mg  daily and after symptoms RESOLVE in 1-2 week Flonase 2 sprays in each nostril daily for next 4-6 weeks, then you may stop and use seasonally or as needed - Recommend to keep using Nasal Saline spray multiple times a day to help flush out congestion and clear sinuses - Improve hydration by drinking plenty of clear fluids (water, gatorade) to reduce secretions and thin congestion  Please schedule a follow-up appointment with Dr. Althea CharonKaramalegos / Dr Juanetta GoslingHawkins 2 weeks if not improved sinusitis / bronchitis  If you have any other questions or concerns, please feel free to call the clinic or send a message through MyChart. You may also schedule an earlier appointment if necessary.  Saralyn PilarAlexander Shametra Cumberland, DO [facility]

## 2016-08-25 ENCOUNTER — Other Ambulatory Visit: Payer: Self-pay | Admitting: Family Medicine

## 2016-08-25 DIAGNOSIS — R002 Palpitations: Secondary | ICD-10-CM

## 2016-08-25 DIAGNOSIS — F419 Anxiety disorder, unspecified: Secondary | ICD-10-CM

## 2017-05-16 ENCOUNTER — Other Ambulatory Visit: Payer: Self-pay

## 2017-05-16 ENCOUNTER — Telehealth: Payer: Self-pay | Admitting: Family Medicine

## 2017-05-16 DIAGNOSIS — F419 Anxiety disorder, unspecified: Secondary | ICD-10-CM

## 2017-05-16 DIAGNOSIS — F32A Depression, unspecified: Secondary | ICD-10-CM

## 2017-05-16 DIAGNOSIS — F329 Major depressive disorder, single episode, unspecified: Secondary | ICD-10-CM

## 2017-05-16 MED ORDER — SERTRALINE HCL 50 MG PO TABS: 50.0000 mg | ORAL_TABLET | Freq: Every day | ORAL | 2 refills | Status: DC

## 2017-05-16 NOTE — Telephone Encounter (Signed)
Pt [place] [facility] [facility] [place]  His call back number is [phone]

## 2017-05-16 NOTE — Telephone Encounter (Signed)
Will fill 90 day supply Zoloft, but he will need to return to office within 3 months to follow-up on this med to continue further refills. I have only seen him once in 06/2016 for sinusitis. He was previously followed by Dr Juanetta GoslingHawkins.  Please notify patient.  Saralyn PilarAlexander Karamalegos, DO [facility] 05/16/2017, 5:25 PM

## 2017-05-16 NOTE — Telephone Encounter (Signed)
Send for approval. 

## 2017-05-22 ENCOUNTER — Other Ambulatory Visit: Payer: Self-pay

## 2017-05-22 ENCOUNTER — Ambulatory Visit: Payer: BLUE CROSS/BLUE SHIELD | Admitting: Family Medicine

## 2017-05-22 ENCOUNTER — Encounter: Payer: Self-pay | Admitting: Family Medicine

## 2017-05-22 VITALS — BP 131/79 | HR 63 | Temp 97.5°F | Resp 16 | Ht 76.0 in | Wt 263.6 lb

## 2017-05-22 DIAGNOSIS — J019 Acute sinusitis, unspecified: Secondary | ICD-10-CM

## 2017-05-22 DIAGNOSIS — H1031 Unspecified acute conjunctivitis, right eye: Secondary | ICD-10-CM

## 2017-05-22 MED ORDER — POLYMYXIN B-TRIMETHOPRIM 10000-0.1 UNIT/ML-% OP SOLN: 1.0000 [drp] | OPHTHALMIC | 0 refills | Status: DC

## 2017-05-22 MED ORDER — AZITHROMYCIN 250 MG PO TABS: ORAL_TABLET | ORAL | 0 refills | Status: DC

## 2017-05-22 MED ORDER — BENZONATATE 100 MG PO CAPS: 100.0000 mg | ORAL_CAPSULE | Freq: Three times a day (TID) | ORAL | 0 refills | Status: DC | PRN

## 2017-05-22 MED ORDER — IPRATROPIUM BROMIDE 0.06 % NA SOLN: 2.0000 | Freq: Four times a day (QID) | NASAL | 0 refills | Status: DC

## 2017-05-22 NOTE — Progress Notes (Signed)
Subjective:    Patient ID: Todd Crawford, male    DOB: March 14, 1961, 56 y.o.   MRN: [id number]  Todd DraftRichard B Charlie is a 56 y.o. male presenting on 05/22/2017 for Sinus Problem (nasal drainage, mild cough, facial pressure mostly around the right eye x 1 week )  Patient presents for a same day appointment.  HPI  Acute Sinusitis / R Eye Conjunctivitis Reports symptoms with sinus issues with runny nose congestion for about 1 week without improvement, associated with bilateral sinus pressure and R ear pressure without ear pain. Increased thin congestion and secretions without purulence - Takes OTC pseudophed PRN with some improved pressure, but felt "BP went up" - Woke up this morning with R eye feeling slightly "irritated" with some thicker discharge and felt "matted" closed and some slight redness appearance to eye. Tried warm rag on eye last night. No drops - Denies any fevers/chills, sweats, nausea vomiting, abdominal pain, diarrhea, muscle aches / body aches, sore throat, occasional cough in morning otherwise no productive cough, dyspnea, loss of vision eye pain  Health Maintenance: Due for Flu Shot, declines today despite counseling on benefits - patient ill today as well, will reconsider   [facility] 02/21/2016 01/03/2016  Decreased Interest 2 0  Down, Depressed, Hopeless 1 0  PHQ - 2 Score 3 0  Altered sleeping 1 -  Tired, decreased energy 1 -  Change in appetite 0 -  Feeling bad or failure about yourself  1 -  Trouble concentrating 2 -  Moving slowly or fidgety/restless 0 -  Suicidal thoughts 0 -  PHQ-9 Score 8 -  Difficult doing work/chores Somewhat difficult -    Social History   Tobacco Use  . Smoking status: Current Some Day Smoker    Types: Cigars  . Smokeless tobacco: Current User  Substance Use Topics  . Alcohol use: Yes    Comment: occasional  . Drug use: No    Review of Systems Per HPI unless specifically indicated above     Objective:    BP 131/79 (BP Location: Left Arm, Patient Position: Sitting, Cuff Size: Large)   Pulse 63   Temp (!) 97.5 F (36.4 C) (Oral)   Resp 16   Ht 6\' 4"  (1.93 m)   Wt 263 lb 9.6 oz (119.6 kg)   SpO2 98%   BMI 32.09 kg/m   Wt Readings from Last 3 Encounters:  05/22/17 263 lb 9.6 oz (119.6 kg)  06/27/16 253 lb (114.8 kg)  06/07/16 256 lb (116.1 kg)    Physical Exam  Constitutional: He appears well-developed and well-nourished. No distress.  Mildly sick appearing, comfortable, cooperative  HENT:  Head: Normocephalic and atraumatic.  Mouth/Throat: Oropharynx is clear and moist.  Frontal sinuses mild tender. Nares patent with some congestion w/o purulence or edema. Bilateral TMs clear without erythema, effusion or bulging. Oropharynx with mild posterior pharyngeal erythema and drainage without  exudates, edema or asymmetry.  Eyes: Right eye exhibits no discharge. Left eye exhibits no discharge.  Right Eye - Appear of minimal erythema and localized swelling of upper eyelid, not extending. Minimal conjunctival injection seems resolving, no discharge  Neck: Normal range of motion. Neck supple.  Cardiovascular: Normal rate, regular rhythm, normal heart sounds and intact distal pulses.  No murmur heard. Pulmonary/Chest: Effort normal and breath sounds normal. No respiratory distress. He has no wheezes. He has no rales.  Musculoskeletal: He exhibits no edema.  Lymphadenopathy:    He has no cervical adenopathy.  Neurological: He is alert.  Skin: Skin is warm and dry. No rash noted. He is not diaphoretic. No erythema.  Psychiatric: His behavior is normal.  Nursing note and vitals reviewed.  No results found for this or any previous visit.    Assessment & Plan:   Problem List Items Addressed This Visit    None    Visit Diagnoses    Acute rhinosinusitis    -  Primary  Consistent with acute frontal rhinosinusitis, likely initially viral URI  allergic rhinitis component with > 1 week not  improved  Plan: 1. Provide empiric Azithromycin antibiotic for use if not improved - Start Atrovent nasal spray decongestant 2 sprays in each nostril up to 4 times daily for 7 days - Start Tessalon Perls take 1 capsule up to 3 times a day as needed for cough 2. Supportive care with nasal saline OTC, hydration 3. Return criteria reviewed     Relevant Medications   azithromycin (ZITHROMAX Z-PAK) 250 MG tablet   benzonatate (TESSALON) 100 MG capsule   ipratropium (ATROVENT) 0.06 % nasal spray   Acute conjunctivitis of right eye, unspecified acute conjunctivitis type      Acute R conjunctivitis for past few days, with mild scleral/conjunctival injection without discharge currently. Suspected viral vs bacterial etiology with sick contact and associated URI symptoms,  - No evidence of complication, foreign body, or extending eyelid / pre-septal cellulitis - inadequate conservative treatment at home  Plan: 1. Reassurance, most likely self limited, empiric coverage with Start Polytrim antibiotic eye drops 1 drop in R eye every 4 hours for 7-10 days 2. Start moist warm compresses over Right eyelid 10-15 min at a time, up to 4-6 times daily until resolution 3. Frequent hand washing, avoid rubbing / scratching eye 4. Strict return precautions for spreading infection 5. Follow-up 1-2 weeks as needed     Relevant Medications   trimethoprim-polymyxin b (POLYTRIM) ophthalmic solution      Meds ordered this encounter  Medications  . trimethoprim-polymyxin b (POLYTRIM) ophthalmic solution    Sig: Place 1 drop into the right eye every 4 (four) hours.    Dispense:  10 mL    Refill:  0  . azithromycin (ZITHROMAX Z-PAK) 250 MG tablet    Sig: If not improved within 24-48 hours or worse, then Take 2 tabs (500mg  total) on Day 1. Take 1 tab (250mg ) daily for next 4 days.    Dispense:  6 tablet    Refill:  0  . benzonatate (TESSALON) 100 MG capsule    Sig: Take 1 capsule (100 mg total) by mouth 3  (three) times daily as needed for cough.    Dispense:  30 capsule    Refill:  0  . ipratropium (ATROVENT) 0.06 % nasal spray    Sig: Place 2 sprays into both nostrils 4 (four) times daily. For up to 5-7 days then stop.    Dispense:  15 mL    Refill:  0     Follow up plan: Return in about 2 weeks (around 06/05/2017), or if symptoms worsen or fail to improve, for sinusitis / conjunctivits.  Saralyn PilarAlexander Karamalegos, DO [facility] 05/23/2017, 12:26 AM

## 2017-05-22 NOTE — Patient Instructions (Addendum)
Thank you for coming to the clinic today.  1.  It sounds like you have a Sinusitis (Bacterial Infection) - this most likely started as an Upper Respiratory Virus that has settled into an infection. Allergies can also cause this.  For R eye concern possible Pink Eye either bacterial or viral - start with antibiotic eye drop every 4 hours for 7 days, may use warm compresses few times daily as well especially in morning  If worsening redness and swelling, pain, decrease in vision or other changes, then notify office or seek more immediate care  Start OTC Coricidin HBP D for decongestant for high blood pressure patients that is safe  Start Atrovent nasal spray decongestant 2 sprays in each nostril up to 4 times daily for 7 days  If coughing worse - Start Tessalon Perls take 1 capsule up to 3 times a day as needed for cough  If not improved or worse sinus pain pressure cough fever - start antibiotic Azithromycin Z pak in 24-48 hours  - Improve hydration by drinking plenty of clear fluids (water, gatorade) to reduce secretions and thin congestion - Congestion draining down throat can cause irritation. May try warm herbal tea with honey, cough drops - Can take Tylenol or Ibuprofen as needed for fevers  If you develop persistent fever >101F for at least 3 consecutive days, headaches with sinus pain or pressure or persistent earache, please schedule a follow-up evaluation within next few days to week.   Please schedule a Follow-up Appointment to: Return in about 2 weeks (around 06/05/2017), or if symptoms worsen or fail to improve, for sinusitis / conjunctivits.    If you have any other questions or concerns, please feel free to call the clinic or send a message through MyChart. You may also schedule an earlier appointment if necessary.  Additionally, you may be receiving a survey about your experience at our clinic within a few days to 1 week by e-mail or mail. We value your feedback.  Saralyn PilarAlexander  Karamalegos, DO [facility]

## 2017-05-23 ENCOUNTER — Encounter: Payer: Self-pay | Admitting: Family Medicine

## 2017-06-19 ENCOUNTER — Encounter: Payer: Self-pay | Admitting: Family Medicine

## 2017-06-19 ENCOUNTER — Ambulatory Visit: Payer: BLUE CROSS/BLUE SHIELD | Admitting: Family Medicine

## 2017-06-19 VITALS — BP 139/79 | HR 68 | Temp 98.1°F | Resp 16 | Ht 76.0 in | Wt 263.2 lb

## 2017-06-19 DIAGNOSIS — H1031 Unspecified acute conjunctivitis, right eye: Secondary | ICD-10-CM

## 2017-06-19 MED ORDER — ERYTHROMYCIN 5 MG/GM OP OINT: 1.0000 "application " | TOPICAL_OINTMENT | Freq: Three times a day (TID) | OPHTHALMIC | 0 refills | Status: DC

## 2017-06-19 NOTE — Progress Notes (Signed)
Subjective:    Patient ID: Todd Draftichard B Stanly, male    DOB: 12/14/1960, 57 y.o.   MRN: [id number]  Todd DraftRichard B Crawford is a 57 y.o. male presenting on 06/19/2017 for Belepharitis (Right eye)  Patient presents for a same day appointment.  HPI   FOLLOW-UP R Eye Conjunctivitis - Last visit with me 05/22/17, for initial visit for same problem with sinusitis/conjunctivitis, treated with Azithromycin antibiotic and Polytrim antibiotic eye drops for R eye, see prior notes for background information. - Interval update with dramatic improvement and resolution in all symptoms, sinus and eye had resolved within 5 days - Today patient reports that he was back to normal nearly 100% for 2-3 weeks after last illness, and now has concern of recurrence with R eye ONLY - Onset new recurrence of symptoms R eye with watering, itching and irritation, he then identified Right upper eyelid seemed slightly swollen and red irritated. He has continued clear drainage from eye most often. Sometimes eye feels tired or discomfort and will rest it by closing it for while and it improves - Tried Vizene eye drops - No known sick contacts with eye injection or pink eye. No other URI sick contacts - Denies any fevers/chills, cough congestion, sinus pain or pressure, ear pain or sore throat - Denies any eye pain, loss of vision, blurry vision   Depression screen [facility] 2/[facility] 02/21/2016 01/03/2016  Decreased Interest 2 0  Down, Depressed, Hopeless 1 0  PHQ - 2 Score 3 0  Altered sleeping 1 -  Tired, decreased energy 1 -  Change in appetite 0 -  Feeling bad or failure about yourself  1 -  Trouble concentrating 2 -  Moving slowly or fidgety/restless 0 -  Suicidal thoughts 0 -  PHQ-9 Score 8 -  Difficult doing work/chores Somewhat difficult -    Social History   Tobacco Use  . Smoking status: Current Some Day Smoker    Types: Cigars  . Smokeless tobacco: Current User  Substance Use Topics  . Alcohol use: Yes    Comment:  occasional  . Drug use: No    Review of Systems Per HPI unless specifically indicated above     Objective:    BP 139/79   Pulse 68   Temp 98.1 F (36.7 C) (Oral)   Resp 16   Ht 6\' 4"  (1.93 m)   Wt 263 lb 3.2 oz (119.4 kg)   BMI 32.04 kg/m   Wt Readings from Last 3 Encounters:  06/19/17 263 lb 3.2 oz (119.4 kg)  05/22/17 263 lb 9.6 oz (119.6 kg)  06/27/16 253 lb (114.8 kg)    Physical Exam  Constitutional: He appears well-developed and well-nourished. No distress.  Now well appearing, comfortable, cooperative  HENT:  Head: Normocephalic and atraumatic.  Mouth/Throat: Oropharynx is clear and moist.  Sinuses non tender. Nares patent without congestion. Bilateral TMs clear without erythema, effusion or bulging. Oropharynx clear now  Eyes: Right eye exhibits no discharge. Left eye exhibits no discharge.  Right Eye - Persistent with slight increase of minimal erythema and localized swelling of upper eyelid, not extending. Now resolved conjunctival injection. No discharge.  Neck: Normal range of motion. Neck supple.  Cardiovascular: Normal rate and intact distal pulses.  Pulmonary/Chest: Effort normal and breath sounds normal. No respiratory distress. He has no wheezes. He has no rales.  Musculoskeletal: He exhibits no edema.  Lymphadenopathy:    He has no cervical adenopathy.  Neurological: He is alert.  Skin: Skin is  warm and dry. No rash noted. He is not diaphoretic. No erythema.  Psychiatric: His behavior is normal.  Nursing note and vitals reviewed.  No results found for this or any previous visit.    Assessment & Plan:   Problem List Items Addressed This Visit    None    Visit Diagnoses    Acute conjunctivitis of right eye, unspecified acute conjunctivitis type    -  Primary  Recurrent acute R conjunctivitis now for few days after had similar problem that was resolved previously associated with sinusitis URI and same R upper eyelid conjunctival problem - Initial  resolution on Azithromycin antibiotic and improvement on R eye polytrim antibiotic eye drops - Now resolved URI - No evidence of complication, foreign body, or extending eyelid / pre-septal cellulitis  Plan: 1. Reassurance, uncertain exact cause bacterial vs viral - possible re-infection - will try alternative treatment with Erythryomycin ophth ointment for up to 1 week, TID use, try upper eyelid apply, since drops not as effective 2. He may resume moist warm compresses over Right eyelid 10-15 min at a time, up to 4-6 times daily until resolution 3. Frequent hand washing, avoid rubbing / scratching eye 4. Strict return precautions for spreading infection 5. Follow-up 1-2 weeks as needed - advised more strict return criteria, when to seek evaluation by Optometry if not improving, he will contact [facility] if needed    Relevant Medications   erythromycin ophthalmic ointment      Meds ordered this encounter  Medications  . erythromycin ophthalmic ointment    Sig: Place 1 application into the right eye 3 (three) times daily. For up to 1 week    Dispense:  3.5 g    Refill:  0      Follow up plan: Return in about 1 week (around 06/26/2017), or if symptoms worsen or fail to improve, for Right eye infection.  Saralyn Pilar, DO [facility] 06/19/2017, 2:23 PM

## 2017-06-19 NOTE — Patient Instructions (Addendum)
Thank you for coming to the office today.  1. No clear cause for re-infection, may be bacterial upper eyelid infection - previously you had viral infection, uncertain if this is new or re-infection  We can try ointment this time - Erythryomycin, use 3 times a day with sterile q-tip or fingertip, place under upper eyelid 3 times daily for now  May try warm or cold compresses - if swelling can use cold, if discomfort and pain use warm  If not improving in 2-3 days can notify office, especially if more red, swollen, painful, drainage of pus, fever or loss of vision - may need stronger oral antibiotic, and likely second opinion from Optometry, [place] go [place]  - if acute change or loss vision go to hospital ED  Please schedule a Follow-up Appointment to: Return in about 1 week (around 06/26/2017), or if symptoms worsen or fail to improve, for Right eye infection.   If you have any other questions or concerns, please feel free to call the office or send a message through MyChart. You may also schedule an earlier appointment if necessary.  Additionally, you may be receiving a survey about your experience at our office within a few days to 1 week by e-mail or mail. We value your feedback.  Saralyn PilarAlexander Karamalegos, DO [facility]

## 2017-06-20 DIAGNOSIS — H00021 Hordeolum internum right upper eyelid: Secondary | ICD-10-CM | POA: Diagnosis not present

## 2017-07-18 ENCOUNTER — Ambulatory Visit: Payer: BLUE CROSS/BLUE SHIELD | Admitting: Family Medicine

## 2017-07-18 ENCOUNTER — Encounter: Payer: Self-pay | Admitting: Family Medicine

## 2017-07-18 VITALS — BP 131/81 | HR 66 | Temp 98.8°F | Resp 16 | Ht 76.0 in | Wt 260.4 lb

## 2017-07-18 DIAGNOSIS — R509 Fever, unspecified: Secondary | ICD-10-CM

## 2017-07-18 DIAGNOSIS — J019 Acute sinusitis, unspecified: Secondary | ICD-10-CM

## 2017-07-18 LAB — POCT INFLUENZA A/B
Influenza A, POC: NEGATIVE
Influenza B, POC: NEGATIVE

## 2017-07-18 MED ORDER — IPRATROPIUM BROMIDE 0.06 % NA SOLN: 2.0000 | Freq: Four times a day (QID) | NASAL | 0 refills | Status: DC

## 2017-07-18 MED ORDER — AMOXICILLIN-POT CLAVULANATE 875-125 MG PO TABS: 1.0000 | ORAL_TABLET | Freq: Two times a day (BID) | ORAL | 0 refills | Status: DC

## 2017-07-18 NOTE — Progress Notes (Signed)
Subjective:    Patient ID: Todd Crawford, male    DOB: 02-03-1961, 57 y.o.   MRN: [id number]  Todd Crawford is a 57 y.o. male presenting on 07/18/2017 for Sinusitis (onset 4 days chills but didn't check temp, bodyache)  Patient presents for a same day appointment.  HPI  SINUSITIS Reports symptoms started 3-4 days ago with chest and sinus congestion, felt associated temperature off felt warm but did not measure temp, thought he had a fever, had some generalized body aches. - Now improved on medicine having less cough (not as productive) some thicker sinus drainage and production though. - Tried OTC DayQuil and NyQuil, and sudafed as needed - Additional update - he did not have conjunctivitis this time but update from previously back 06/19/17 [facility] and [facility] did evaluate and found to be more of a "blocked duct" and treated with eye drops and antibiotic, it improved he can return as needed - Admits still temperature off at night, today seems better  Health Maintenance: Did not get Flu Shot this season.  Depression screen [facility] 2/[facility] 02/21/2016 01/03/2016  Decreased Interest 2 0  Down, Depressed, Hopeless 1 0  PHQ - 2 Score 3 0  Altered sleeping 1 -  Tired, decreased energy 1 -  Change in appetite 0 -  Feeling bad or failure about yourself  1 -  Trouble concentrating 2 -  Moving slowly or fidgety/restless 0 -  Suicidal thoughts 0 -  PHQ-9 Score 8 -  Difficult doing work/chores Somewhat difficult -    Social History   Tobacco Use  . Smoking status: Current Some Day Smoker    Types: Cigars  . Smokeless tobacco: Current User  Substance Use Topics  . Alcohol use: Yes    Comment: occasional  . Drug use: No    Review of Systems Per HPI unless specifically indicated above     Objective:    BP 131/81   Pulse 66   Temp 98.8 F (37.1 C) (Oral)   Resp 16   Ht 6\' 4"  (1.93 m)   Wt 260 lb 6.4 oz (118.1 kg)   SpO2 98%   BMI 31.70 kg/m   Wt Readings  from Last 3 Encounters:  07/18/17 260 lb 6.4 oz (118.1 kg)  06/19/17 263 lb 3.2 oz (119.4 kg)  05/22/17 263 lb 9.6 oz (119.6 kg)    Physical Exam  Constitutional: He is oriented to person, place, and time. He appears well-developed and well-nourished. No distress.  Mildly ill and tired appearing, comfortable, cooperative  HENT:  Head: Normocephalic and atraumatic.  Mouth/Throat: Oropharynx is clear and moist.  Frontal sinuses mild tender. Nares with some turbinate edema, without purulence, there is localized mild edema and erythema at tip of nares medially without obvious pustule. Bilateral TMs clear without erythema or bulging, it seems L is slightly more full with mild clear effusion. Oropharynx non specific posterior drainage without erythema, exudates, edema or asymmetry.  Eyes: Conjunctivae are normal. Right eye exhibits no discharge. Left eye exhibits no discharge.  Neck: Normal range of motion. Neck supple.  Cardiovascular: Normal rate, regular rhythm, normal heart sounds and intact distal pulses.  No murmur heard. Pulmonary/Chest: Effort normal and breath sounds normal. No respiratory distress. He has no wheezes. He has no rales.  Good air movement  Musculoskeletal: Normal range of motion. He exhibits no edema.  Lymphadenopathy:    He has no cervical adenopathy.  Neurological: He is alert and oriented  to person, place, and time.  Skin: Skin is warm and dry. No rash noted. He is not diaphoretic. No erythema.  Psychiatric: His behavior is normal.  Nursing note and vitals reviewed.  Results for orders placed or performed in visit on 07/18/17  POCT Influenza A/B  Result Value Ref Range   Influenza A, POC Negative Negative   Influenza B, POC Negative Negative      Assessment & Plan:   Problem List Items Addressed This Visit    None    Visit Diagnoses    Acute rhinosinusitis    -  Primary   Relevant Medications   amoxicillin-clavulanate (AUGMENTIN) 875-125 MG tablet    ipratropium (ATROVENT) 0.06 % nasal spray   Fever and chills       Relevant Orders   POCT Influenza A/B (Completed)      Consistent with acute frontal rhinosinusitis, likely initially viral URI vs allergic rhinitis component without clear evidence of bacterial infection today - Concern with history for possible flu, however seems less convincing - RAPID FLU was NEGATIVE  Plan: 1. Reassurance, likely self-limited - no indication for antibiotics at this time - however patient is traveling out of state tomorrow, discussion on empiric antibiotic rx to only fill if not improving or worsening criteria - as he has had similar sinusitis before that has responded to antibiotics - rx given for Augmentin 875-125mg  PO BID x 10 days - Start Atrovent nasal spray decongestant 2 sprays in each nostril up to 4 times daily for 7 days - use topical neosporin for nares - if has sore - Mucinex OTC - Supportive care Return criteria reviewed   Meds ordered this encounter  Medications  . amoxicillin-clavulanate (AUGMENTIN) 875-125 MG tablet    Sig: Take 1 tablet by mouth 2 (two) times daily. For up to 10 days    Dispense:  20 tablet    Refill:  0  . ipratropium (ATROVENT) 0.06 % nasal spray    Sig: Place 2 sprays into both nostrils 4 (four) times daily. For up to 5-7 days then stop.    Dispense:  15 mL    Refill:  0      Follow up plan: Return if symptoms worsen or fail to improve, for sinusitis.   Saralyn Pilar, DO [facility] 07/18/2017, 5:27 PM

## 2017-07-18 NOTE — Patient Instructions (Addendum)
Thank you for coming to the office today.  1. It sounds like you have a Sinusitis (Bacterial Infection) - this most likely started as an Upper Respiratory Virus that has settled into an infection. Allergies can also cause this.  Flu Test completed today  - Start Augmentin 1 pill twice daily (breakfast and dinner, with food and plenty of water) for 10 days, complete entire course, do not stop early even if feeling better  Start Atrovent nasal spray decongestant 2 sprays in each nostril up to 4 times daily for 7 days  Use topical Neosporin antibiotic on Q-tip inside nose over sore spots for 1-2 times a day for 1-2 weeks to help heal  - Recommend to start using Nasal Saline spray multiple times a day to help flush out congestion and clear sinuses - Improve hydration by drinking plenty of clear fluids (water, gatorade) to reduce secretions and thin congestion - Congestion draining down throat can cause irritation. May try warm herbal tea with honey, cough drops - Can take Tylenol or Ibuprofen as needed for fevers - May continue over the counter cold medicine as you are, I would not use any decongestant or mucinex longer than 7 days.  If you develop persistent fever >101F for at least 3 consecutive days, headaches with sinus pain or pressure or persistent earache, please schedule a follow-up evaluation within next few days to week.   Please schedule a Follow-up Appointment to: Return if symptoms worsen or fail to improve, for sinusitis.    If you have any other questions or concerns, please feel free to call the office or send a message through MyChart. You may also schedule an earlier appointment if necessary.  Additionally, you may be receiving a survey about your experience at our office within a few days to 1 week by e-mail or mail. We value your feedback.  Saralyn PilarAlexander Tulio Facundo, DO [facility]

## 2017-08-13 ENCOUNTER — Other Ambulatory Visit: Payer: Self-pay | Admitting: Family Medicine

## 2017-08-13 DIAGNOSIS — L820 Inflamed seborrheic keratosis: Secondary | ICD-10-CM | POA: Diagnosis not present

## 2017-08-13 DIAGNOSIS — F32A Depression, unspecified: Secondary | ICD-10-CM

## 2017-08-13 DIAGNOSIS — F329 Major depressive disorder, single episode, unspecified: Secondary | ICD-10-CM

## 2017-08-13 DIAGNOSIS — D485 Neoplasm of uncertain behavior of skin: Secondary | ICD-10-CM | POA: Diagnosis not present

## 2017-08-13 DIAGNOSIS — F419 Anxiety disorder, unspecified: Secondary | ICD-10-CM

## 2017-08-13 DIAGNOSIS — L570 Actinic keratosis: Secondary | ICD-10-CM | POA: Diagnosis not present

## 2017-09-17 ENCOUNTER — Telehealth: Payer: Self-pay | Admitting: Family Medicine

## 2017-09-17 ENCOUNTER — Other Ambulatory Visit: Payer: Self-pay

## 2017-09-17 DIAGNOSIS — F419 Anxiety disorder, unspecified: Secondary | ICD-10-CM

## 2017-09-17 DIAGNOSIS — R002 Palpitations: Secondary | ICD-10-CM

## 2017-09-17 DIAGNOSIS — H00022 Hordeolum internum right lower eyelid: Secondary | ICD-10-CM | POA: Diagnosis not present

## 2017-09-17 MED ORDER — METOPROLOL TARTRATE 25 MG PO TABS: 12.5000 mg | ORAL_TABLET | Freq: Two times a day (BID) | ORAL | 0 refills | Status: DC

## 2017-09-17 NOTE — Telephone Encounter (Signed)
Pt. Called requesting refill on  Metoprolol  25 mg   [facility]  [place]

## 2017-09-17 NOTE — Telephone Encounter (Signed)
Rx send

## 2017-11-30 IMAGING — DX DG HIP (WITH OR WITHOUT PELVIS) 2-3V*L*
4 series · 4 of 4 positions shown · non-contrast
Comparison: No recent prior .

CLINICAL DATA: Sciatica.

EXAM:
DG HIP (WITH OR WITHOUT PELVIS) 2-3V LEFT

[pelvis ap (1 of 2)]
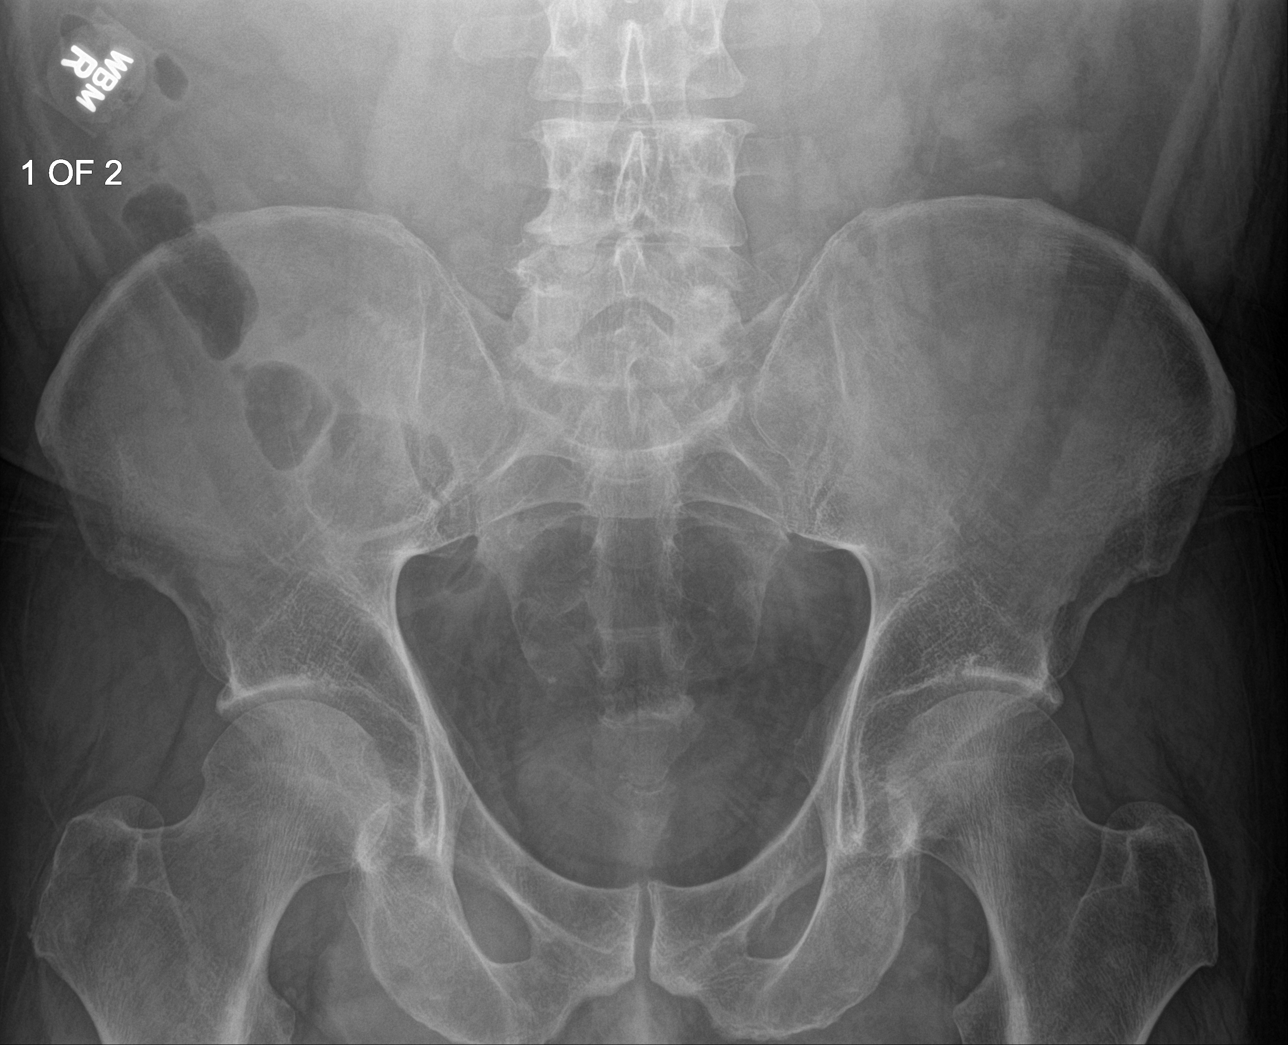

[hip ap]
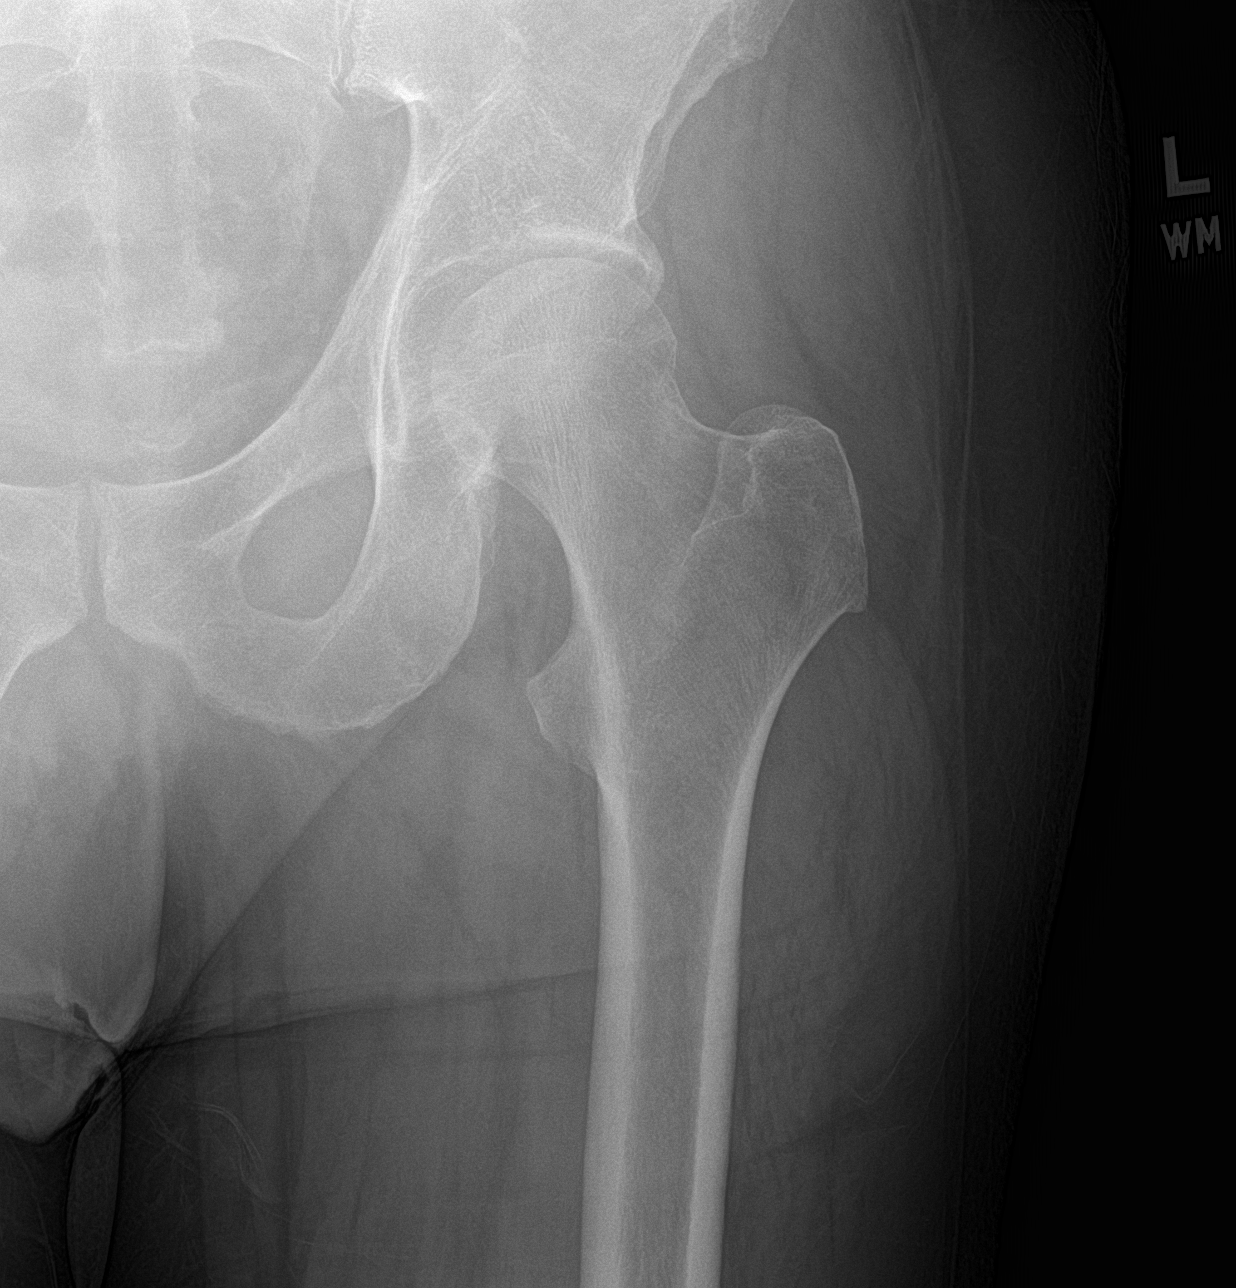

[hip frog leg]
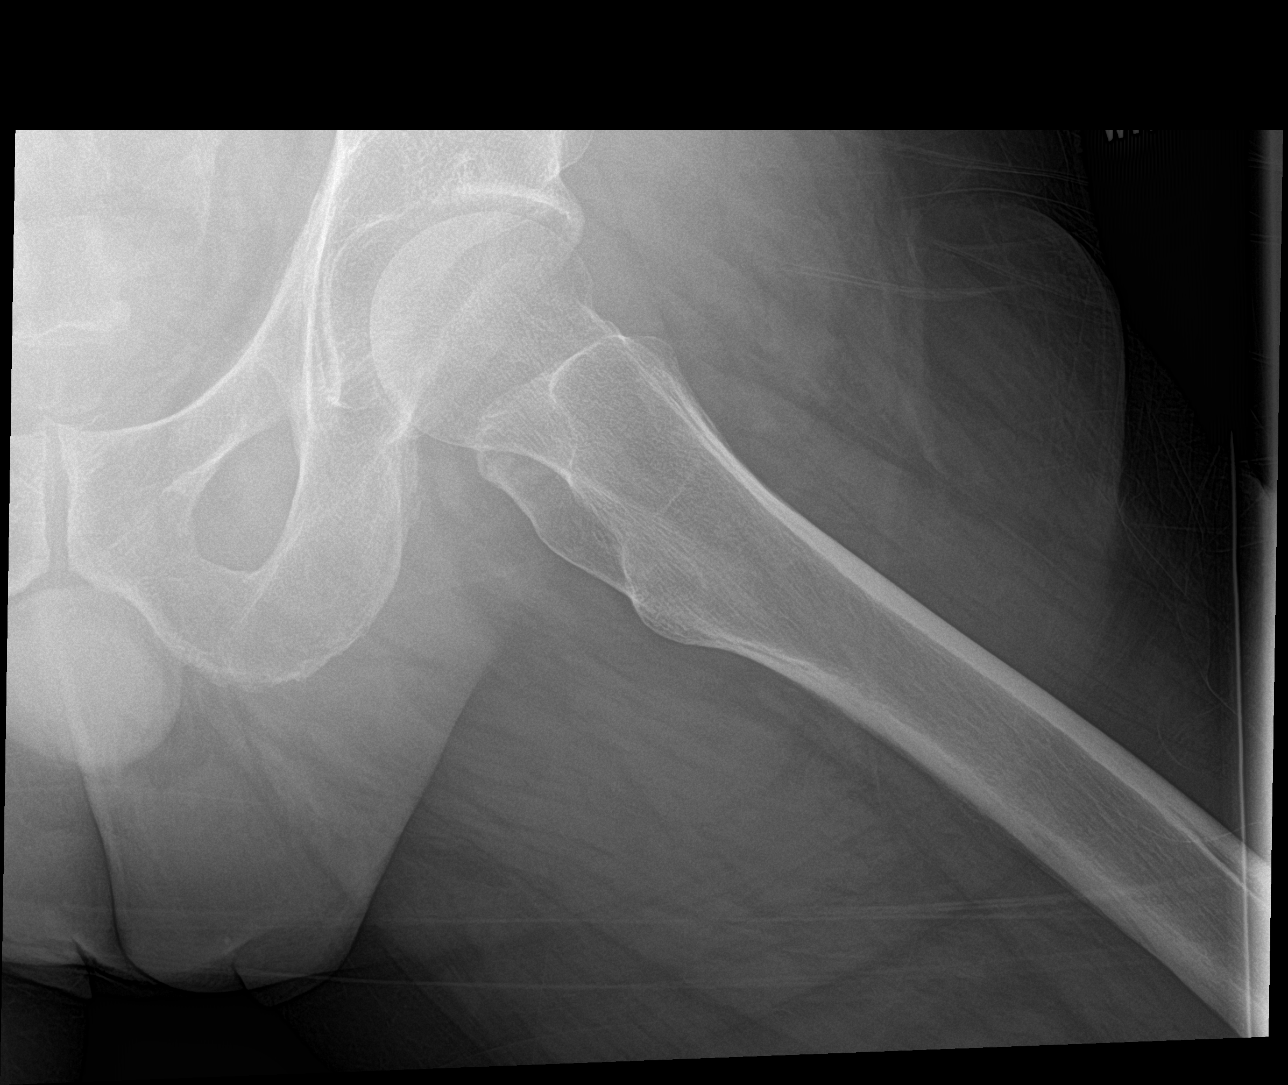

[pelvis ap (2 of 2)]
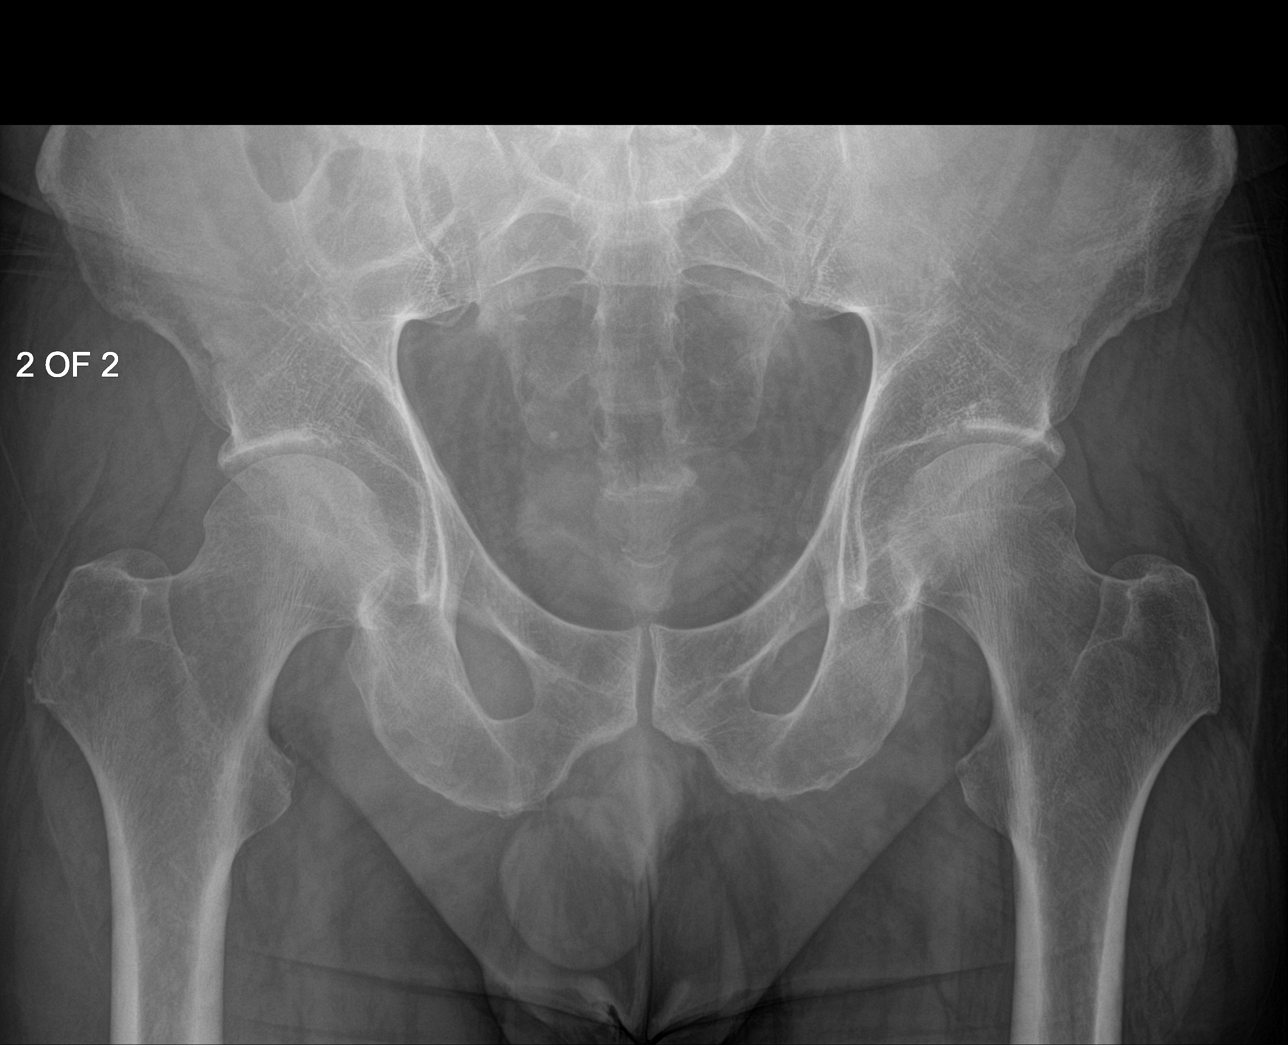

[4 of 4 positions shown; findings below may reference images not displayed]

FINDINGS: Degenerative changes lumbar spine and both hips. No acute bony or
joint abnormality identified. Pelvic calcifications consistent with
phleboliths.
IMPRESSION: No acute abnormality.

## 2017-12-13 ENCOUNTER — Other Ambulatory Visit: Payer: Self-pay | Admitting: Family Medicine

## 2017-12-13 DIAGNOSIS — F419 Anxiety disorder, unspecified: Secondary | ICD-10-CM

## 2017-12-13 DIAGNOSIS — R002 Palpitations: Secondary | ICD-10-CM

## 2017-12-19 DIAGNOSIS — H0012 Chalazion right lower eyelid: Secondary | ICD-10-CM | POA: Diagnosis not present

## 2018-01-13 ENCOUNTER — Other Ambulatory Visit: Payer: Self-pay | Admitting: Family Medicine

## 2018-01-13 DIAGNOSIS — F419 Anxiety disorder, unspecified: Secondary | ICD-10-CM

## 2018-01-13 DIAGNOSIS — F32A Depression, unspecified: Secondary | ICD-10-CM

## 2018-01-13 DIAGNOSIS — F329 Major depressive disorder, single episode, unspecified: Secondary | ICD-10-CM

## 2018-01-17 DIAGNOSIS — H0011 Chalazion right upper eyelid: Secondary | ICD-10-CM | POA: Diagnosis not present

## 2018-01-22 DIAGNOSIS — L608 Other nail disorders: Secondary | ICD-10-CM | POA: Diagnosis not present

## 2018-01-22 DIAGNOSIS — Z79899 Other long term (current) drug therapy: Secondary | ICD-10-CM | POA: Diagnosis not present

## 2018-01-22 DIAGNOSIS — M79671 Pain in right foot: Secondary | ICD-10-CM | POA: Diagnosis not present

## 2018-01-22 DIAGNOSIS — M722 Plantar fascial fibromatosis: Secondary | ICD-10-CM | POA: Diagnosis not present

## 2018-01-22 DIAGNOSIS — L603 Nail dystrophy: Secondary | ICD-10-CM | POA: Diagnosis not present

## 2018-01-22 DIAGNOSIS — B351 Tinea unguium: Secondary | ICD-10-CM | POA: Diagnosis not present

## 2018-02-19 DIAGNOSIS — M722 Plantar fascial fibromatosis: Secondary | ICD-10-CM | POA: Diagnosis not present

## 2018-02-22 DIAGNOSIS — H0012 Chalazion right lower eyelid: Secondary | ICD-10-CM | POA: Diagnosis not present

## 2018-02-26 DIAGNOSIS — H0012 Chalazion right lower eyelid: Secondary | ICD-10-CM | POA: Diagnosis not present

## 2018-03-13 ENCOUNTER — Other Ambulatory Visit: Payer: Self-pay | Admitting: Family Medicine

## 2018-03-13 DIAGNOSIS — R002 Palpitations: Secondary | ICD-10-CM

## 2018-03-13 DIAGNOSIS — F419 Anxiety disorder, unspecified: Secondary | ICD-10-CM

## 2018-03-15 DIAGNOSIS — H0012 Chalazion right lower eyelid: Secondary | ICD-10-CM | POA: Diagnosis not present

## 2018-06-26 ENCOUNTER — Ambulatory Visit (INDEPENDENT_AMBULATORY_CARE_PROVIDER_SITE_OTHER): Payer: BLUE CROSS/BLUE SHIELD | Admitting: Family Medicine

## 2018-06-26 ENCOUNTER — Encounter: Payer: Self-pay | Admitting: Family Medicine

## 2018-06-26 VITALS — BP 136/89 | HR 60 | Temp 97.8°F | Resp 16 | Ht 76.0 in | Wt 269.0 lb

## 2018-06-26 DIAGNOSIS — Z23 Encounter for immunization: Secondary | ICD-10-CM | POA: Diagnosis not present

## 2018-06-26 DIAGNOSIS — N528 Other male erectile dysfunction: Secondary | ICD-10-CM

## 2018-06-26 DIAGNOSIS — F411 Generalized anxiety disorder: Secondary | ICD-10-CM

## 2018-06-26 DIAGNOSIS — I10 Essential (primary) hypertension: Secondary | ICD-10-CM | POA: Diagnosis not present

## 2018-06-26 MED ORDER — SILDENAFIL CITRATE 20 MG PO TABS: ORAL_TABLET | ORAL | 2 refills | Status: DC

## 2018-06-26 MED ORDER — ESCITALOPRAM OXALATE 10 MG PO TABS: 10.0000 mg | ORAL_TABLET | Freq: Every day | ORAL | 2 refills | Status: DC

## 2018-06-26 NOTE — Progress Notes (Signed)
Subjective:    Patient ID: Todd Crawford, male    DOB: 10-Jul-1960, 58 y.o.   MRN: [id number]  Todd DraftRichard B Stickels is a 58 y.o. male presenting on 06/26/2018 for Anxiety and Hypertension   HPI   Anxiety, situational / social - Anxiety described as more situational if has public speaking it can worsen his anxiety - Prior history of past PCP Dr Juanetta GoslingHawkins tried Zoloft in past dose 25 to 50mg , now has been on this for several years, he is not sure when it was started. He is still taking Zoloft 50mg  daily for now. Asking if other med out there as possible to switch since zoloft less effective now. He has not been on others. - Denies significant depression, see score below, has had minor symptoms in past but not directly related to mood, he attributes some symptoms to his anxiety as primary issue  CHRONIC HTN: Reports not checking BP, he has taken Metoprool for a while from prior PCP for his anxiety and also BP Current Meds - Metoprolol 12.5mg  BID (half of 25mg  pill)   Reports good compliance, took meds today. Tolerating well, w/o complaints. Denies CP, dyspnea, HA, edema, dizziness / lightheadedness  Erectile Dysfunction Chronic problem, in past used sildenafil, requesting refill today.  Health Maintenance: Due for Flu Shot, will receive today   Will return for annual physical and labs soon  Depression screen [facility] 2/9 06/26/2018 02/21/2016 01/03/2016  Decreased Interest 0 2 0  Down, Depressed, Hopeless 0 1 0  PHQ - 2 Score 0 3 0  Altered sleeping 1 1 -  Tired, decreased energy 0 1 -  Change in appetite 0 0 -  Feeling bad or failure about yourself  0 1 -  Trouble concentrating 3 2 -  Moving slowly or fidgety/restless 0 0 -  Suicidal thoughts 0 0 -  PHQ-9 Score 4 8 -  Difficult doing work/chores Not difficult at all Somewhat difficult -   GAD 7 : Generalized Anxiety Score 06/26/2018  Nervous, Anxious, on Edge 1  Control/stop worrying 1  Worry too much - different things 1  Trouble  relaxing 1  Restless 0  Easily annoyed or irritable 1  Afraid - awful might happen 1  Total GAD 7 Score 6  Anxiety Difficulty Not difficult at all     Social History   Tobacco Use  . Smoking status: Current Some Day Smoker    Types: Cigars  . Smokeless tobacco: Current User  Substance Use Topics  . Alcohol use: Yes    Comment: occasional  . Drug use: No    Review of Systems Per HPI unless specifically indicated above     Objective:    BP 136/89   Pulse 60   Temp 97.8 F (36.6 C) (Oral)   Resp 16   Ht 6\' 4"  (1.93 m)   Wt 269 lb (122 kg)   BMI 32.74 kg/m   Wt Readings from Last 3 Encounters:  06/26/18 269 lb (122 kg)  07/18/17 260 lb 6.4 oz (118.1 kg)  06/19/17 263 lb 3.2 oz (119.4 kg)    Physical Exam Vitals signs and nursing note reviewed.  Constitutional:      General: He is not in acute distress.    Appearance: He is well-developed. He is not diaphoretic.     Comments: Well-appearing, comfortable, cooperative  HENT:     Head: Normocephalic and atraumatic.  Eyes:     General:        Right  eye: No discharge.        Left eye: No discharge.     Conjunctiva/sclera: Conjunctivae normal.  Neck:     Musculoskeletal: Normal range of motion and neck supple.     Thyroid: No thyromegaly.  Cardiovascular:     Rate and Rhythm: Normal rate and regular rhythm.     Heart sounds: Normal heart sounds. No murmur.  Pulmonary:     Effort: Pulmonary effort is normal. No respiratory distress.     Breath sounds: Normal breath sounds. No wheezing or rales.  Musculoskeletal: Normal range of motion.  Lymphadenopathy:     Cervical: No cervical adenopathy.  Skin:    General: Skin is warm and dry.     Findings: No erythema or rash.  Neurological:     Mental Status: He is alert and oriented to person, place, and time.  Psychiatric:        Behavior: Behavior normal.     Comments: Well groomed, good eye contact, normal speech and thoughts        Assessment & Plan:    Problem List Items Addressed This Visit    ED (erectile dysfunction)    Multifactorial etiology, stable chronic  Re order Sildenafil 1-5 pills as advised      Relevant Medications   sildenafil (REVATIO) 20 MG tablet   Essential hypertension - Primary    Mostly well-controlled HTN Borderline elevated BP in past >135/85 - Home BP readings none  Complication with anxiety, due for labs for other eval    Plan:  1. INCREASE Metoprolol from 12.5mg  BID up to 25mg  BID - whole tablet per dose - notify when ready for new rx 2. Encourage improved lifestyle - low sodium diet, regular exercise 3. Start monitor BP outside office, bring readings to next visit, if persistently >140/90 or new symptoms notify office sooner 4. Follow-up 3 months for annual and labs       Relevant Medications   sildenafil (REVATIO) 20 MG tablet   GAD (generalized anxiety disorder)    Suspected persistent chronic anxiety / situational affecting him more recently, limited benefit of Sertraline SSRI - See GAD score - No prior Psych / counseling  Plan: 1. Discussion on new diagnosis anxiety, management, complications 2. TAPER OFF Sertraline from 50 to 25mg  (half tab) daily for 3-5 days then [facility] to Escitalopram 10mg  daily AM with food, counseling on potential side effects risks, reviewed possible GI intolerance, insomnia (although likely to improve this given anxiety likely source of insomnia), sexual dysfunction, anticipate 4-6 weeks for notable effect, may need titrate dose in up or down future 3. Future recommend therapist if needed 4. Follow-up 3 months, annual, sooner if need      Relevant Medications   escitalopram (LEXAPRO) 10 MG tablet    Other Visit Diagnoses    Needs flu shot       Relevant Orders   Flu Vaccine QUAD 36+ mos IM (Completed)      Meds ordered this encounter  Medications  . escitalopram (LEXAPRO) 10 MG tablet    Sig: Take 1 tablet (10 mg total) by mouth daily.    Dispense:  30  tablet    Refill:  2  . sildenafil (REVATIO) 20 MG tablet    Sig: Take 1-5 pills about 30 min prior to sex. Start with 1 and increase as needed.    Dispense:  30 tablet    Refill:  2      Follow up plan: Return in about  3 months (around 09/25/2018) for Annual Physical.  Future labs ordered for 09/19/18  Saralyn Pilar, DO [facility] 06/26/2018, 11:07 AM

## 2018-06-26 NOTE — Patient Instructions (Addendum)
Thank you for coming to the office today.  Taper off Zoloft 50mg  - cut in half for 25mg  for about 3-5 days, then switch to new med Escitalopram (Lexapro) 10mg  daily, continue that until return. Call if need to adjust dose down or up.  Refilled Sildenafil  INCREASE Metoprolol from half pill twice daily up to 1 whole pill twice daily.  Flu shot today  DUE for FASTING BLOOD WORK (no food or drink after midnight before the lab appointment, only water or coffee without cream/sugar on the morning of)  SCHEDULE "Lab Only" visit in the morning at the clinic for lab draw in 3 MONTHS   - Make sure Lab Only appointment is at about 1 week before your next appointment, so that results will be available  For Lab Results, once available within 2-3 days of blood draw, you can can log in to MyChart online to view your results and a brief explanation. Also, we can discuss results at next follow-up visit.   Please schedule a Follow-up Appointment to: Return in about 3 months (around 09/25/2018) for Annual Physical.  If you have any other questions or concerns, please feel free to call the office or send a message through MyChart. You may also schedule an earlier appointment if necessary.  Additionally, you may be receiving a survey about your experience at our office within a few days to 1 week by e-mail or mail. We value your feedback.  Saralyn PilarAlexander Tomothy Eddins, DO [facility]

## 2018-06-27 ENCOUNTER — Other Ambulatory Visit: Payer: Self-pay | Admitting: Family Medicine

## 2018-06-27 DIAGNOSIS — I10 Essential (primary) hypertension: Secondary | ICD-10-CM

## 2018-06-27 DIAGNOSIS — Z125 Encounter for screening for malignant neoplasm of prostate: Secondary | ICD-10-CM

## 2018-06-27 DIAGNOSIS — Z0000 Encounter for general adult medical examination without abnormal findings: Secondary | ICD-10-CM

## 2018-06-27 DIAGNOSIS — Z1159 Encounter for screening for other viral diseases: Secondary | ICD-10-CM

## 2018-06-27 DIAGNOSIS — Z114 Encounter for screening for human immunodeficiency virus [HIV]: Secondary | ICD-10-CM

## 2018-06-27 DIAGNOSIS — F411 Generalized anxiety disorder: Secondary | ICD-10-CM

## 2018-06-27 NOTE — Assessment & Plan Note (Signed)
Suspected persistent chronic anxiety / situational affecting him more recently, limited benefit of Sertraline SSRI - See GAD score - No prior Psych / counseling  Plan: 1. Discussion on new diagnosis anxiety, management, complications 2. TAPER OFF Sertraline from 50 to 25mg  (half tab) daily for 3-5 days then [facility] to Escitalopram 10mg  daily AM with food, counseling on potential side effects risks, reviewed possible GI intolerance, insomnia (although likely to improve this given anxiety likely source of insomnia), sexual dysfunction, anticipate 4-6 weeks for notable effect, may need titrate dose in up or down future 3. Future recommend therapist if needed 4. Follow-up 3 months, annual, sooner if need

## 2018-06-27 NOTE — Assessment & Plan Note (Addendum)
Mostly well-controlled HTN Borderline elevated BP in past >135/85 - Home BP readings none  Complication with anxiety, due for labs for other eval    Plan:  1. INCREASE Metoprolol from 12.5mg  BID up to 25mg  BID - whole tablet per dose - notify when ready for new rx 2. Encourage improved lifestyle - low sodium diet, regular exercise 3. Start monitor BP outside office, bring readings to next visit, if persistently >140/90 or new symptoms notify office sooner 4. Follow-up 3 months for annual and labs

## 2018-06-27 NOTE — Assessment & Plan Note (Signed)
Multifactorial etiology, stable chronic  Re order Sildenafil 1-5 pills as advised

## 2018-07-19 ENCOUNTER — Other Ambulatory Visit: Payer: Self-pay | Admitting: Family Medicine

## 2018-07-19 DIAGNOSIS — F411 Generalized anxiety disorder: Secondary | ICD-10-CM

## 2018-09-19 ENCOUNTER — Other Ambulatory Visit: Payer: BLUE CROSS/BLUE SHIELD

## 2018-09-25 ENCOUNTER — Encounter: Payer: BLUE CROSS/BLUE SHIELD | Admitting: Family Medicine

## 2018-11-12 DIAGNOSIS — B351 Tinea unguium: Secondary | ICD-10-CM | POA: Diagnosis not present

## 2018-11-12 DIAGNOSIS — Z79899 Other long term (current) drug therapy: Secondary | ICD-10-CM | POA: Diagnosis not present

## 2019-01-14 ENCOUNTER — Other Ambulatory Visit: Payer: Self-pay

## 2019-01-14 ENCOUNTER — Ambulatory Visit (INDEPENDENT_AMBULATORY_CARE_PROVIDER_SITE_OTHER): Payer: BC Managed Care – PPO | Admitting: Family Medicine

## 2019-01-14 ENCOUNTER — Encounter: Payer: Self-pay | Admitting: Family Medicine

## 2019-01-14 DIAGNOSIS — R509 Fever, unspecified: Secondary | ICD-10-CM

## 2019-01-14 DIAGNOSIS — Z20822 Contact with and (suspected) exposure to covid-19: Secondary | ICD-10-CM

## 2019-01-14 DIAGNOSIS — R6889 Other general symptoms and signs: Secondary | ICD-10-CM

## 2019-01-14 NOTE — Progress Notes (Signed)
Virtual Visit via Telephone The purpose of this virtual visit is to provide medical care while limiting exposure to the novel coronavirus (COVID19) for both patient and office staff.  Consent was obtained for phone visit:  Yes.   Answered questions that patient had about telehealth interaction:  Yes.   I discussed the limitations, risks, security and privacy concerns of performing an evaluation and management service by telephone. I also discussed with the patient that there may be a patient responsible charge related to this service. The patient expressed understanding and agreed to proceed.  Patient Location: Home Provider Location: [facility])   ---------------------------------------------------------------------- Chief Complaint  Patient presents with  . Fever    pt was travelled [facility] and came back yesterday, bodyache, fever, cough, exhausted onset 2 days    S: Reviewed CMA documentation. I have called patient and gathered additional HPI as follows:  POSSIBLE CORONAVIRUS vs Viral Syndrome vs Hay Fever Allergic Response Reports that symptoms started yesterday with flu like symptoms fever (99.37F), body ache, and tired or exhausted, fairly often coughing, worse with deep breathing. He wears mask out in public, but taking it off in restaurant. He says in past he used to have severe allergic hay fever episode in past when traveling to this same area. 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 Denies any high risk travel to areas of current concern for COVID19. Denies any known or suspected exposure to person with or possibly with COVID19.  Denies any sweats, shortness of breath, sinus pain or pressure, headache, abdominal pain, diarrhea  -------------------------------------------------------------------------- O: No physical exam performed due to remote telephone  encounter.  -------------------------------------------------------------------------- A&P:   SUSPECTED COVID19 Febrile, constellation of mild flu like symptoms consistent with COVID19 - Reassuring without high risk symptoms - without dyspnea - No comorbid pulmonary conditions (asthma, COPD) or immunocompromise  1. Recommend COVID19 testing to be performed ASAP, location recommended [facility] test site [facility]), advised patient info on test site and procedures for testing, first come first serve, order placed in Epic, no apt needed.  See quarantine recommendations below  No orders of the defined types were placed in this encounter.   REQUIRED self quarantine to [place] - advised to avoid all exposure with others while during TESTING (Pending result) and treatment. Should continue to quarantine for up to 7-14 days - pending resolution of symptoms, if TEST IS NEGATIVE and symptoms resolve by 7 days and is afebrile >3 days - may STOP self quarantine at that time. IF test is POSITIVE then will require 10 additional day quarantine after date of positive test result.    If symptoms do not resolve or significantly improve OR if WORSENING - fever / cough - or worsening shortness of breath - then should contact us and seek advice on next steps in treatment at home vs where/when to seek care at Urgent Care or Hospital ED for further intervention and possible testing if indicated.  Patient verbalizes understanding with the above medical recommendations including the limitation of remote medical advice.  Specific follow-up / call-back criteria were given for patient to follow-up or seek medical care more urgently if needed.   - Time spent in direct consultation with patient on phone: 9 minutes   Nobie Putnam, [facility] 01/14/2019, 4:45 PM

## 2019-01-14 NOTE — Patient Instructions (Addendum)
Thank you for coming to the office today.  You may have coronavirus / [place] Testing Information  I have placed an order in the [facility] system.  All you need to do is arrive at a testing site. No appointment needed.  Hours (Open 8 a.m. - 3:45 p.m.) LAST TEST completed at 3:30pm  [place]: [facility], [address], [place], [place]: [place], [place], [place], [place] (entrance off [address])  [place]: [place]. Main [address] [place], [place] (across from [facility])  Test result may take 2-7 days to result. You will be notified by MyChart or by Phone.  Phone: [phone] [facility] contact, can inquire about status of test result)  If negative test - they will call you with result. If abnormal or positive test you will be notified as well and our office will contact you to help further with treatment plan.  May take Tylenol as needed for aches pains and fever. Prefer to avoid Ibuprofen if can help it, to avoid complication from virus.  REQUIRED self quarantine to [place] - advised to avoid all exposure with others while during treatment. Should continue to quarantine for up to 7-14 days, pending resolution of symptoms, if symptoms resolve by 7 days and is afebrile >3 days - may STOP self quarantine at that time.  If symptoms do not resolve or significantly improve OR if WORSENING - fever / cough - or worsening shortness of breath - then should contact us and seek advice on next steps in treatment at home vs where/when to seek care at Urgent Care or Hospital ED for further intervention   Please schedule a Follow-up Appointment to: Return in about 1 week (around 01/21/2019), or if symptoms worsen or fail to improve.  If you have any other questions or concerns, please feel free to call the office or send a message through  [place]. You may also schedule an earlier appointment if necessary.  Additionally, you may be receiving a survey about your experience at our office within a few days to 1 week by e-mail or mail. We value your feedback.  Nobie Putnam, DO [place]

## 2019-01-15 ENCOUNTER — Other Ambulatory Visit: Payer: Self-pay

## 2019-01-15 DIAGNOSIS — Z20822 Contact with and (suspected) exposure to covid-19: Secondary | ICD-10-CM

## 2019-01-16 LAB — NOVEL CORONAVIRUS, NAA: SARS-CoV-2, NAA: DETECTED — AB

## 2019-01-17 ENCOUNTER — Telehealth: Payer: Self-pay | Admitting: Family Medicine

## 2019-01-17 NOTE — Telephone Encounter (Signed)
Acknowledged.  Todd Crawford, [facility] 01/17/2019, 12:09 PM

## 2019-01-17 NOTE — Telephone Encounter (Signed)
Pt wanted you to know he tested positive for covid

## 2019-03-07 ENCOUNTER — Other Ambulatory Visit: Payer: Self-pay | Admitting: Family Medicine

## 2019-03-07 DIAGNOSIS — R002 Palpitations: Secondary | ICD-10-CM

## 2019-03-07 DIAGNOSIS — F419 Anxiety disorder, unspecified: Secondary | ICD-10-CM

## 2019-09-20 ENCOUNTER — Other Ambulatory Visit: Payer: Self-pay | Admitting: Nurse Practitioner

## 2019-09-20 DIAGNOSIS — R002 Palpitations: Secondary | ICD-10-CM

## 2019-09-20 DIAGNOSIS — F419 Anxiety disorder, unspecified: Secondary | ICD-10-CM

## 2019-09-20 NOTE — Telephone Encounter (Signed)
<   3 months overdue for office visit. Advise pt this is a 30 day courtesy refill and needs to call office to make a f/u appt. Requested Prescriptions  Pending Prescriptions Disp Refills  . metoprolol tartrate (LOPRESSOR) 25 MG tablet [Pharmacy Med Name: METOPROLOL TARTRATE 25 MG TAB] 30 tablet 0    Sig: TAKE 0.5 TABLETS (12.5 MG TOTAL) BY MOUTH 2 (TWO) TIMES DAILY.     Cardiovascular:  Beta Blockers Failed - 09/20/2019 12:49 AM      Failed - Valid encounter within last 6 months    Recent Outpatient Visits          8 months ago Suspected Covid-19 Virus Infection   [facility] [place], Netta Neat, DO   1 year ago Essential hypertension   [facility], Netta Neat, DO   2 years ago Acute rhinosinusitis   [facility] [place], Netta Neat, DO   2 years ago Acute conjunctivitis of right eye, unspecified acute conjunctivitis type   [facility], Netta Neat, DO   2 years ago Acute rhinosinusitis   [facility] Smitty Cords, DO             Passed - Last BP in normal range    BP Readings from Last 1 Encounters:  06/26/18 136/89         Passed - Last Heart Rate in normal range    Pulse Readings from Last 1 Encounters:  06/26/18 60

## 2019-09-24 ENCOUNTER — Other Ambulatory Visit: Payer: Self-pay | Admitting: Family Medicine

## 2019-09-24 DIAGNOSIS — F411 Generalized anxiety disorder: Secondary | ICD-10-CM

## 2019-09-24 NOTE — Telephone Encounter (Signed)
Requested medications are due for refill today?  Yes  Requested medications are on active medication list?  Yes  Last Refill:   07/19/2018  # 90 with one refill   Future visit scheduled?  No   Notes to Clinic:  Medication refill failed RX refill protocol due to no valid encounter in the last 6 months.  Patient's last visit 8 months ago.  Not sure patient has been taking this medication on a consistent basis based on the last refill information above.

## 2019-10-04 ENCOUNTER — Other Ambulatory Visit: Payer: Self-pay | Admitting: Family Medicine

## 2019-10-04 DIAGNOSIS — R002 Palpitations: Secondary | ICD-10-CM

## 2019-10-04 DIAGNOSIS — F419 Anxiety disorder, unspecified: Secondary | ICD-10-CM

## 2019-10-04 NOTE — Telephone Encounter (Signed)
Called pt and LM to call back to make appt. LRF: 09/20/19 # 30 and was courtesy refill

## 2019-11-26 ENCOUNTER — Other Ambulatory Visit: Payer: Self-pay

## 2019-11-26 ENCOUNTER — Telehealth (INDEPENDENT_AMBULATORY_CARE_PROVIDER_SITE_OTHER): Payer: BC Managed Care – PPO | Admitting: Family Medicine

## 2019-11-26 ENCOUNTER — Encounter: Payer: Self-pay | Admitting: Family Medicine

## 2019-11-26 DIAGNOSIS — F411 Generalized anxiety disorder: Secondary | ICD-10-CM

## 2019-11-26 DIAGNOSIS — F419 Anxiety disorder, unspecified: Secondary | ICD-10-CM | POA: Diagnosis not present

## 2019-11-26 DIAGNOSIS — J0110 Acute frontal sinusitis, unspecified: Secondary | ICD-10-CM

## 2019-11-26 DIAGNOSIS — R002 Palpitations: Secondary | ICD-10-CM

## 2019-11-26 DIAGNOSIS — B001 Herpesviral vesicular dermatitis: Secondary | ICD-10-CM

## 2019-11-26 DIAGNOSIS — I10 Essential (primary) hypertension: Secondary | ICD-10-CM | POA: Diagnosis not present

## 2019-11-26 MED ORDER — ESCITALOPRAM OXALATE 10 MG PO TABS: 10.0000 mg | ORAL_TABLET | Freq: Every day | ORAL | 1 refills | Status: DC

## 2019-11-26 MED ORDER — METOPROLOL TARTRATE 25 MG PO TABS: 12.5000 mg | ORAL_TABLET | Freq: Two times a day (BID) | ORAL | 1 refills | Status: DC

## 2019-11-26 MED ORDER — IPRATROPIUM BROMIDE 0.06 % NA SOLN: 2.0000 | Freq: Four times a day (QID) | NASAL | 0 refills | Status: AC

## 2019-11-26 MED ORDER — VALACYCLOVIR HCL 1 G PO TABS: ORAL_TABLET | ORAL | 1 refills | Status: DC

## 2019-11-26 MED ORDER — AMOXICILLIN-POT CLAVULANATE 875-125 MG PO TABS: 1.0000 | ORAL_TABLET | Freq: Two times a day (BID) | ORAL | 0 refills | Status: DC

## 2019-11-26 NOTE — Patient Instructions (Addendum)
Thank you for coming to the office today.  Refilled meds for 6 months  Ordered Valtrex for fever blister  Only fill Augmentin antibiotic IF not improving in 48 hours.  Start Atrovent nasal spray decongestant 2 sprays in each nostril up to 4 times daily for 7 days  Schedule ANNUAL PHYSICAL FIRST and we can do blood work  -  DUE for FASTING BLOOD WORK (no food or drink after midnight before the lab appointment, only water or coffee without cream/sugar on the morning of)   Please schedule a Follow-up Appointment to: Return in about 5 months (around 04/27/2020) for Annual Physical.  If you have any other questions or concerns, please feel free to call the office or send a message through MyChart. You may also schedule an earlier appointment if necessary.  Additionally, you may be receiving a survey about your experience at our office within a few days to 1 week by e-mail or mail. We value your feedback.  Saralyn Pilar, DO [facility], [place]

## 2019-11-26 NOTE — Progress Notes (Signed)
Subjective:    Patient ID: Todd Crawford, male    DOB: 11-22-1960, 59 y.o.   MRN: [id number]  MCIHAEL HINDERMAN is a 59 y.o. male presenting on 11/26/2019 for Sinusitis (eye pressure Right side onset yesterday denies fever or chills or bodyache)  Virtual / Telehealth Encounter - Video Visit via MyChart The purpose of this virtual visit is to provide medical care while limiting exposure to the novel coronavirus (COVID19) for both patient and office staff.  Consent was obtained for remote visit:  Yes.   Answered questions that patient had about telehealth interaction:  Yes.   I discussed the limitations, risks, security and privacy concerns of performing an evaluation and management service by video/telephone. I also discussed with the patient that there may be a patient responsible charge related to this service. The patient expressed understanding and agreed to proceed.  Patient Location: Home Provider Location: [facility] (Office)   HPI   Sinusitis, acute Fever blister oral Reports symptoms onset 1-2 days ago with R sided sinus pressure and pain, similar to past. - Admits a fever blister on his lip, usually happens about same time each year - has treated it with acyclovir or valtrex in past with 1 day dosing. - Limited trial on OTC medications - In past he has had similar symptoms yearly, improved with Atrovent nasal decongestant He will be leaving on Monday next week for a vacation Denies any fevers chills cough nausea vomiting headache body aches  Anxiety, situational / social Chronic problem Anxiety described as more situational if has public speaking it can worsen his anxiety Previously on Sertraline, off since 2019. Today he is doing well currently on Escitalopram 10mg  daily Denies significant depression, see score below, has had minor symptoms in past but not directly related to mood, he attributes some symptoms to his anxiety as primary issue  CHRONIC  HTN: Reports not checking BP at this time. He is doing well, needs refill on metoprolol also for anxiety Current Meds - Metoprolol 12.5mg  BID (half of 25mg  pill)   Reports good compliance, took meds today. Tolerating well, w/o complaints. Denies CP, dyspnea, HA, edema, dizziness / lightheadedness   HTN, GAD med refills  Health Maintenance: Due for colonoscopy and other health maintenance topics, did not address fully today as virtual will return in 4-6 months.  Depression screen [facility] 2/9 11/26/2019 01/14/2019 06/26/2018  Decreased Interest 0 0 0  Down, Depressed, Hopeless 0 0 0  PHQ - 2 Score 0 0 0  Altered sleeping - - 1  Tired, decreased energy - - 0  Change in appetite - - 0  Feeling bad or failure about yourself  - - 0  Trouble concentrating - - 3  Moving slowly or fidgety/restless - - 0  Suicidal thoughts - - 0  PHQ-9 Score - - 4  Difficult doing work/chores - - Not difficult at all   GAD 7 : Generalized Anxiety Score 11/26/2019 01/14/2019 06/26/2018  Nervous, Anxious, on Edge 1 1 1   Control/stop worrying 1 2 1   Worry too much - different things 1 3 1   Trouble relaxing 0 0 1  Restless 0 0 0  Easily annoyed or irritable 1 0 1  Afraid - awful might happen 0 0 1  Total GAD 7 Score 4 6 6   Anxiety Difficulty Not difficult at all Not difficult at all Not difficult at all     Social History   Tobacco Use   Smoking status: Former  Smoker    Types: Cigars   Smokeless tobacco: Former Neurosurgeon   Tobacco comment: occassional very rare if drinks   Substance Use Topics   Alcohol use: Yes    Comment: occasional   Drug use: No    Review of Systems Per HPI unless specifically indicated above     Objective:    There were no vitals taken for this visit.  Wt Readings from Last 3 Encounters:  06/26/18 269 lb (122 kg)  07/18/17 260 lb 6.4 oz (118.1 kg)  06/19/17 263 lb 3.2 oz (119.4 kg)    Physical Exam   Note examination was completely remotely via video observation  objective data only  Gen - well-appearing, no acute distress or apparent pain, comfortable HEENT - eyes appear clear without discharge or redness Heart/Lungs - cannot examine virtually - observed no evidence of coughing or labored breathing. Abd - cannot examine virtually  Skin - face visible today- no rash Neuro - awake, alert, oriented Psych - not anxious appearing   Results for orders placed or performed in visit on 01/15/19  Novel Coronavirus, NAA ([facility])   Specimen: Oropharyngeal(OP) collection in vial transport medium   OROPHARYNGEA  TESTING  Result Value Ref Range   SARS-CoV-2, NAA Detected (A) Not Detected      Assessment & Plan:   Problem List Items Addressed This Visit    Heart palpitations   Relevant Medications   metoprolol tartrate (LOPRESSOR) 25 MG tablet   GAD (generalized anxiety disorder)    Controlled Chronic and situational anxiety No prior Psych Continue on Escitalopram 10mg  daily, refill      Relevant Medications   escitalopram (LEXAPRO) 10 MG tablet   metoprolol tartrate (LOPRESSOR) 25 MG tablet   Essential hypertension   Relevant Medications   metoprolol tartrate (LOPRESSOR) 25 MG tablet    Other Visit Diagnoses    Acute non-recurrent frontal sinusitis    -  Primary   Relevant Medications   ipratropium (ATROVENT) 0.06 % nasal spray   valACYclovir (VALTREX) 1000 MG tablet   amoxicillin-clavulanate (AUGMENTIN) 875-125 MG tablet   Anxiety       Relevant Medications   escitalopram (LEXAPRO) 10 MG tablet   metoprolol tartrate (LOPRESSOR) 25 MG tablet   Fever blister       Relevant Medications   valACYclovir (VALTREX) 1000 MG tablet      #Fever Blister, oral Recent acute outbreak, stress related Trial on Valacyclovir 1g x2 = 2g BID x 1 day. Or can do 1g BID x 7 day  #Sinusitis Consistent with acute frontal sinusitis, likely initially viral URI allergic rhinitis component. No sign of bacterial infection currently  Plan: 1. Reassurance,  likely self-limited - no indication for antibiotics at this time - Patient will be out of town and may warrant antibiotic in future, offer empiric rx to hold if not needed, if worse >48 hours can pick up rx  Start Augmentin 875-125mg  PO BID x 7 days 2. Start Atrovent nasal spray decongestant 2 sprays in each nostril up to 4 times daily for 7 days 3.Supportive care with nasal saline OTC, hydration 4. Return criteria reviewed  #HTN - question regarding Metoprolol dosing previously was half of 25mg  for 12.5mg  BID, it was corrected for 90 pills for 90 day. Seems unclear even after med rec today. Will clarify with patient by phone/mychart message. He can use rx that was already sent but if he is taking whole pill 25mg  it will only last 45 days, he may notify  me back and I can re order for correct dosing.   Meds ordered this encounter  Medications   DISCONTD: metoprolol tartrate (LOPRESSOR) 25 MG tablet    Sig: Take 0.5 tablets (12.5 mg total) by mouth 2 (two) times daily.    Dispense:  180 tablet    Refill:  1   escitalopram (LEXAPRO) 10 MG tablet    Sig: Take 1 tablet (10 mg total) by mouth daily.    Dispense:  90 tablet    Refill:  1   ipratropium (ATROVENT) 0.06 % nasal spray    Sig: Place 2 sprays into both nostrils 4 (four) times daily. For up to 5-7 days then stop.    Dispense:  15 mL    Refill:  0   valACYclovir (VALTREX) 1000 MG tablet    Sig: For 1 day treatment fever blister can take 2 pills twice a day for 1 day. If preferred can take 1 pill twice a day for 7 days instead, only as needed.    Dispense:  20 tablet    Refill:  1   amoxicillin-clavulanate (AUGMENTIN) 875-125 MG tablet    Sig: Take 1 tablet by mouth 2 (two) times daily. For 7 days - only if not improving with sinusitis infection within next 48 hours    Dispense:  14 tablet    Refill:  0   metoprolol tartrate (LOPRESSOR) 25 MG tablet    Sig: Take 0.5 tablets (12.5 mg total) by mouth 2 (two) times daily.     Dispense:  90 tablet    Refill:  1    Corrected pill count, should be 90 pills for half pill twice a day for 90 day supply.      Follow up plan: Return in about 5 months (around 04/27/2020) for Annual Physical.   Patient verbalizes understanding with the above medical recommendations including the limitation of remote medical advice.  Specific follow-up and call-back criteria were given for patient to follow-up or seek medical care more urgently if needed.  Total duration of direct patient care provided via video conference: 15 minutes   Saralyn Pilar, DO [facility] 11/26/2019, 2:24 PM

## 2019-11-27 NOTE — Assessment & Plan Note (Addendum)
Controlled Chronic and situational anxiety No prior Psych Continue on Escitalopram 10mg  daily, refill

## 2020-04-15 DIAGNOSIS — H01002 Unspecified blepharitis right lower eyelid: Secondary | ICD-10-CM | POA: Diagnosis not present

## 2020-04-23 ENCOUNTER — Other Ambulatory Visit: Payer: Self-pay | Admitting: Family Medicine

## 2020-04-23 DIAGNOSIS — N528 Other male erectile dysfunction: Secondary | ICD-10-CM

## 2020-04-23 MED ORDER — SILDENAFIL CITRATE 20 MG PO TABS: ORAL_TABLET | ORAL | 0 refills | Status: AC

## 2020-04-23 NOTE — Telephone Encounter (Signed)
Pt request refill  sildenafil (REVATIO) 20 MG tablet  [id number] - [place], [place] - [address] Phone:  [phone]  Fax:  [phone]

## 2020-05-26 ENCOUNTER — Other Ambulatory Visit: Payer: Self-pay | Admitting: Family Medicine

## 2020-05-26 DIAGNOSIS — R002 Palpitations: Secondary | ICD-10-CM

## 2020-05-26 DIAGNOSIS — F419 Anxiety disorder, unspecified: Secondary | ICD-10-CM

## 2020-05-26 DIAGNOSIS — I10 Essential (primary) hypertension: Secondary | ICD-10-CM

## 2020-05-26 NOTE — Telephone Encounter (Signed)
Requested medication (s) are due for refill today: Yes  Requested medication (s) are on the active medication list: Yes  Last refill:  11/26/19  Future visit scheduled: No  Notes to clinic:  Left pt. Message to make appointment.    Requested Prescriptions  Pending Prescriptions Disp Refills   metoprolol tartrate (LOPRESSOR) 25 MG tablet [Pharmacy Med Name: METOPROLOL TARTRATE 25 MG TAB] 90 tablet 1    Sig: TAKE 1/2 TABLET BY MOUTH TWICE A DAY      Cardiovascular:  Beta Blockers Failed - 05/26/2020  1:08 AM      Failed - Valid encounter within last 6 months    Recent Outpatient Visits           6 months ago Acute non-recurrent frontal sinusitis   [facility] Smitty Cords, DO   1 year ago Suspected Covid-19 Virus Infection   [facility] Smitty Cords, DO   1 year ago Essential hypertension   [facility] [place], Netta Neat, DO   2 years ago Acute rhinosinusitis   [facility] [place], Netta Neat, DO   2 years ago Acute conjunctivitis of right eye, unspecified acute conjunctivitis type   [facility], Netta Neat, DO                Passed - Last BP in normal range    BP Readings from Last 1 Encounters:  06/26/18 136/89          Passed - Last Heart Rate in normal range    Pulse Readings from Last 1 Encounters:  06/26/18 60

## 2020-08-24 ENCOUNTER — Other Ambulatory Visit: Payer: Self-pay | Admitting: Family Medicine

## 2020-08-24 DIAGNOSIS — R002 Palpitations: Secondary | ICD-10-CM

## 2020-08-24 DIAGNOSIS — F419 Anxiety disorder, unspecified: Secondary | ICD-10-CM

## 2020-08-24 DIAGNOSIS — I10 Essential (primary) hypertension: Secondary | ICD-10-CM

## 2020-08-28 ENCOUNTER — Other Ambulatory Visit: Payer: Self-pay | Admitting: Family Medicine

## 2020-08-28 DIAGNOSIS — F411 Generalized anxiety disorder: Secondary | ICD-10-CM

## 2020-08-28 NOTE — Telephone Encounter (Signed)
Requested medications are due for refill today yes  Requested medications are on the active medication list yes  Last refill 12/23  Last visit 11/2019  Future visit scheduled no, was to return in 5 months (Nov) but did not, no visit scheduled  Notes to clinic Failed protocol due to no valid visit within 6  months, no visit scheduled.

## 2020-09-08 ENCOUNTER — Other Ambulatory Visit: Payer: Self-pay | Admitting: Family Medicine

## 2020-09-08 DIAGNOSIS — I10 Essential (primary) hypertension: Secondary | ICD-10-CM

## 2020-09-08 DIAGNOSIS — F419 Anxiety disorder, unspecified: Secondary | ICD-10-CM

## 2020-09-08 DIAGNOSIS — R002 Palpitations: Secondary | ICD-10-CM

## 2020-12-23 ENCOUNTER — Other Ambulatory Visit: Payer: Self-pay | Admitting: Family Medicine

## 2020-12-23 DIAGNOSIS — F411 Generalized anxiety disorder: Secondary | ICD-10-CM

## 2020-12-23 NOTE — Telephone Encounter (Signed)
Requested medication (s) are due for refill today: expired medication  Requested medication (s) are on the active medication list: yes  Last refill:  11/26/19 #90 1 refill   Future visit scheduled: no  Notes to clinic:  no valid encounter in 1 year. Expired medication. Do you want courtesy refill? Called patent to schedule appt. No answer, LVMTCB     Requested Prescriptions  Pending Prescriptions Disp Refills   escitalopram (LEXAPRO) 10 MG tablet [Pharmacy Med Name: ESCITALOPRAM 10 MG TABLET] 90 tablet 1    Sig: TAKE 1 TABLET BY MOUTH EVERY DAY      Psychiatry:  Antidepressants - SSRI Failed - 12/23/2020  2:04 PM      Failed - Valid encounter within last 6 months    Recent Outpatient Visits           1 year ago Acute non-recurrent frontal sinusitis   [facility] Smitty Cords, DO   1 year ago Suspected Covid-19 Virus Infection   [facility] Smitty Cords, DO   2 years ago Essential hypertension   [facility] Smitty Cords, DO   3 years ago Acute rhinosinusitis   [facility] [place], Netta Neat, DO   3 years ago Acute conjunctivitis of right eye, unspecified acute conjunctivitis type   [facility] [place], Netta Neat, DO

## 2021-01-11 DIAGNOSIS — Z79899 Other long term (current) drug therapy: Secondary | ICD-10-CM | POA: Diagnosis not present

## 2021-01-11 DIAGNOSIS — I10 Essential (primary) hypertension: Secondary | ICD-10-CM | POA: Diagnosis not present

## 2021-01-11 DIAGNOSIS — R0609 Other forms of dyspnea: Secondary | ICD-10-CM | POA: Diagnosis not present

## 2021-01-11 DIAGNOSIS — F419 Anxiety disorder, unspecified: Secondary | ICD-10-CM | POA: Diagnosis not present

## 2021-01-11 DIAGNOSIS — N529 Male erectile dysfunction, unspecified: Secondary | ICD-10-CM | POA: Diagnosis not present

## 2021-02-09 ENCOUNTER — Other Ambulatory Visit: Payer: Self-pay | Admitting: Family Medicine

## 2021-02-09 DIAGNOSIS — F411 Generalized anxiety disorder: Secondary | ICD-10-CM

## 2021-02-09 NOTE — Telephone Encounter (Signed)
PATIENT IS OUT OF THIS MED, REQUESTS SHORT SUPPLYMedication Refill - Medication: escitalopram (LEXAPRO) 10 MG tablet   Has the patient contacted their pharmacy? yes (Agent: If no, request that the patient contact the pharmacy for the refill.) (Agent: If yes, when and what did the pharmacy advise?)contact pcp  Preferred Pharmacy (with phone number or street name):  [id number] - [place], [place] - [address] Phone:  [phone]  Fax:  [phone]      Agent: Please be advised that RX refills may take up to 3 business days. We ask that you follow-up with your pharmacy.

## 2021-02-10 MED ORDER — ESCITALOPRAM OXALATE 10 MG PO TABS: 10.0000 mg | ORAL_TABLET | Freq: Every day | ORAL | 1 refills | Status: DC

## 2021-02-10 NOTE — Telephone Encounter (Signed)
Requested medications are due for refill today.  yes  Requested medications are on the active medications list.  yes  Last refill. 11/26/2019  Future visit scheduled.   yes  Notes to clinic.  Prescription is expired.

## 2021-02-17 ENCOUNTER — Telehealth (INDEPENDENT_AMBULATORY_CARE_PROVIDER_SITE_OTHER): Payer: BC Managed Care – PPO | Admitting: Family Medicine

## 2021-02-17 ENCOUNTER — Encounter: Payer: Self-pay | Admitting: Family Medicine

## 2021-02-17 ENCOUNTER — Other Ambulatory Visit: Payer: Self-pay

## 2021-02-17 VITALS — Ht 72.0 in | Wt 267.0 lb

## 2021-02-17 DIAGNOSIS — F411 Generalized anxiety disorder: Secondary | ICD-10-CM

## 2021-02-17 MED ORDER — HYDROXYZINE HCL 10 MG PO TABS: 10.0000 mg | ORAL_TABLET | Freq: Four times a day (QID) | ORAL | 2 refills | Status: DC | PRN

## 2021-02-17 NOTE — Progress Notes (Signed)
Virtual Visit via Telephone The purpose of this virtual visit is to provide medical care while limiting exposure to the novel coronavirus (COVID19) for both patient and office staff.  Consent was obtained for phone visit:  Yes.   Answered questions that patient had about telehealth interaction:  Yes.   I discussed the limitations, risks, security and privacy concerns of performing an evaluation and management service by telephone. I also discussed with the patient that there may be a patient responsible charge related to this service. The patient expressed understanding and agreed to proceed.  Patient Location: Home Provider Location: [facility] (Office)  Participants in virtual visit: - Patient: "Todd Crawford" Hewitt Shorts - CMA: Burnell Blanks, CMA - Provider: Dr Althea Charon  ---------------------------------------------------------------------- Chief Complaint  Patient presents with   Anxiety    S: Reviewed CMA documentation. I have called patient and gathered additional HPI as follows:  Anxiety, situational / social Chronic problem Anxiety described as more situational if has public speaking it can worsen his anxiety Previously on Sertraline, off since 2019. Today he is doing well currently on Escitalopram 10mg  daily - needs authorization for more refills.  Denies significant depression, see score below, has had minor symptoms in past but not directly related to mood, he attributes some symptoms to his anxiety as primary issue   CHRONIC HTN: Reports controlled HTN Current Meds - Metoprolol half tab of 25mg  twice a day   Reports good compliance, took meds today. Tolerating well, w/o complaints. Denies CP, dyspnea, HA, edema, dizziness / lightheadedness  Denies any fevers, chills, sweats, body ache, cough, shortness of breath, sinus pain or pressure, headache, abdominal pain, diarrhea  Past Medical History:  Diagnosis Date   Anxiety state, unspecified     Social History   Tobacco Use   Smoking status: Former    Types: Cigars   Smokeless tobacco: Former   Tobacco comments:    occassional very rare if drinks   Substance Use Topics   Alcohol use: Yes    Comment: occasional   Drug use: No    Current Outpatient Medications:    escitalopram (LEXAPRO) 10 MG tablet, Take 1 tablet (10 mg total) by mouth daily., Disp: 90 tablet, Rfl: 1   hydrOXYzine (ATARAX/VISTARIL) 10 MG tablet, Take 1-2 tablets (10-20 mg total) by mouth every 6 (six) hours as needed for anxiety., Disp: 30 tablet, Rfl: 2   ipratropium (ATROVENT) 0.06 % nasal spray, Place 2 sprays into both nostrils 4 (four) times daily. For up to 5-7 days then stop., Disp: 15 mL, Rfl: 0   metoprolol tartrate (LOPRESSOR) 25 MG tablet, TAKE 1/2 TABLET BY MOUTH TWICE A DAY, Disp: 90 tablet, Rfl: 1   sildenafil (REVATIO) 20 MG tablet, Take 1-5 pills about 30 min prior to sex. Start with 1 and increase as needed., Disp: 30 tablet, Rfl: 0   valACYclovir (VALTREX) 1000 MG tablet, For 1 day treatment fever blister can take 2 pills twice a day for 1 day. If preferred can take 1 pill twice a day for 7 days instead, only as needed., Disp: 20 tablet, Rfl: 1  Depression screen [facility] 2/9 02/17/2021 11/26/2019 01/14/2019  Decreased Interest 0 0 0  Down, Depressed, Hopeless 0 0 0  PHQ - 2 Score 0 0 0  Altered sleeping - - -  Tired, decreased energy - - -  Change in appetite - - -  Feeling bad or failure about yourself  - - -  Trouble concentrating - - -  Moving slowly or fidgety/restless - - -  Suicidal thoughts - - -  PHQ-9 Score - - -  Difficult doing work/chores - - -    GAD 7 : Generalized Anxiety Score 02/17/2021 11/26/2019 01/14/2019 06/26/2018  Nervous, Anxious, on Edge 1 1 1 1   Control/stop worrying 1 1 2 1   Worry too much - different things 1 1 3 1   Trouble relaxing 0 0 0 1  Restless 0 0 0 0  Easily annoyed or irritable 1 1 0 1  Afraid - awful might happen 0 0 0 1  Total GAD 7 Score 4 4 6 6    Anxiety Difficulty Not difficult at all Not difficult at all Not difficult at all Not difficult at all    -------------------------------------------------------------------------- O: No physical exam performed due to remote telephone encounter.  Lab results reviewed.  No results found for this or any previous visit (from the past 2160 hour(s)).  -------------------------------------------------------------------------- A&P:  Problem List Items Addressed This Visit     GAD (generalized anxiety disorder) - Primary   Relevant Medications   hydrOXYzine (ATARAX/VISTARIL) 10 MG tablet   GAD Also some possible panic attack situational anxiety Controlled mostly day to day on SSRI lexapro 10mg  daily, authorize renewal on this and continue 90 day with 1 refill Discussed situational anxiety panic and will recommend Hydroxyzine 10mg  PRN dose can take 1-2 prior to anxiety situation / meeting etc. We considered Buspar taking few days prior or day of, defer for now instead. He asked about generic valium, took this 20 years ago, and wife has taken it. I advised it is controlled and not the optimal treatment regimen recommended, would reconsider if not successful on these other medications.   Meds ordered this encounter  Medications   hydrOXYzine (ATARAX/VISTARIL) 10 MG tablet    Sig: Take 1-2 tablets (10-20 mg total) by mouth every 6 (six) hours as needed for anxiety.    Dispense:  30 tablet    Refill:  2    Follow-up: - Return as needed within 1-3 months for anxiety / also return within 3-6 months for physical  Patient verbalizes understanding with the above medical recommendations including the limitation of remote medical advice.  Specific follow-up and call-back criteria were given for patient to follow-up or seek medical care more urgently if needed.   - Time spent in direct consultation with patient on phone: 9 minutes   , DO [facility] 02/17/2021, 11:45 AM

## 2021-02-17 NOTE — Patient Instructions (Addendum)
   Please schedule a Follow-up Appointment to: Return in about 3 months (around 05/19/2021) for anxiety or can do physical / labs.  If you have any other questions or concerns, please feel free to call the office or send a message through MyChart. You may also schedule an earlier appointment if necessary.  Additionally, you may be receiving a survey about your experience at our office within a few days to 1 week by e-mail or mail. We value your feedback.  Saralyn Pilar, DO [facility], [place]

## 2021-03-21 ENCOUNTER — Other Ambulatory Visit: Payer: Self-pay

## 2021-03-21 ENCOUNTER — Ambulatory Visit: Payer: BC Managed Care – PPO | Admitting: Family Medicine

## 2021-03-21 ENCOUNTER — Ambulatory Visit
Admission: RE | Admit: 2021-03-21 | Discharge: 2021-03-21 | Disposition: A | Discharge status: Home / Self Care | Payer: BC Managed Care – PPO | Attending: Family Medicine | Admitting: Family Medicine

## 2021-03-21 ENCOUNTER — Ambulatory Visit
Admission: RE | Admit: 2021-03-21 | Discharge: 2021-03-21 | Disposition: A | Discharge status: Home / Self Care | Payer: BC Managed Care – PPO | Source: Ambulatory Visit | Attending: Family Medicine | Admitting: Family Medicine

## 2021-03-21 ENCOUNTER — Encounter: Payer: Self-pay | Admitting: Family Medicine

## 2021-03-21 VITALS — BP 134/85 | HR 60 | Ht 72.0 in | Wt 269.4 lb

## 2021-03-21 DIAGNOSIS — M79671 Pain in right foot: Secondary | ICD-10-CM

## 2021-03-21 DIAGNOSIS — M7989 Other specified soft tissue disorders: Secondary | ICD-10-CM | POA: Diagnosis not present

## 2021-03-21 MED ORDER — NAPROXEN 500 MG PO TABS: 500.0000 mg | ORAL_TABLET | Freq: Two times a day (BID) | ORAL | 1 refills | Status: AC

## 2021-03-21 NOTE — Progress Notes (Signed)
Subjective:    Patient ID: Todd Crawford, male    DOB: 11/03/1960, 60 y.o.   MRN: [id number]  Todd Crawford is a 60 y.o. male presenting on 03/21/2021 for Foot Pain   HPI  Right Foot Pain / Strain Reports stepped off a step stool 2 weeks ago, had initial pain symptom like a strain, mid foot dorsal arch area felt worse pain and swelling more he is on the foot, if resting and elevating foot it feels better, worse with ambulation and direct pressure, worse at night after being active during the day. He is able to put weight on it but he is trying to avoid putting direct pressure on it. 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 No history of gout Denies any heel pain or ankle pain or instability Denies any other joint pain    Depression screen [facility] 2/9 03/21/2021 02/17/2021 11/26/2019  Decreased Interest 0 0 0  Down, Depressed, Hopeless 0 0 0  PHQ - 2 Score 0 0 0  Altered sleeping 0 - -  Tired, decreased energy 0 - -  Change in appetite 0 - -  Feeling bad or failure about yourself  0 - -  Trouble concentrating 0 - -  Moving slowly or fidgety/restless 0 - -  Suicidal thoughts 0 - -  PHQ-9 Score 0 - -  Difficult doing work/chores Not difficult at all - -    Social History   Tobacco Use   Smoking status: Former    Types: Cigars   Smokeless tobacco: Former   Tobacco comments:    occassional very rare if drinks   Substance Use Topics   Alcohol use: Yes    Comment: occasional   Drug use: No    Review of Systems Per HPI unless specifically indicated above     Objective:    BP 134/85   Pulse 60   Ht 6' (1.829 m)   Wt 269 lb 6.4 oz (122.2 kg)   SpO2 98%   BMI 36.54 kg/m   Wt Readings from Last 3 Encounters:  03/21/21 269 lb 6.4 oz (122.2 kg)  02/17/21 267 lb (121.1 kg)  06/26/18 269 lb (122 kg)    Physical Exam Vitals and nursing note  reviewed.  Constitutional:      General: He is not in acute distress.    Appearance: He is well-developed. He is not diaphoretic.  Musculoskeletal:     Comments: R Foot exam Appears to have mild localized edema of dorsal mid foot arch, some bony prominence mild tender but no distinct bony tenderness. 5th metatarsal head non tender, heel non tender, achilles tendon intact and non tender no edema. Forefoot non tender has full range of motion. Ankle inversion eversion not provoking symptoms. Standing barefoot flat with intact arches  Skin:    General: Skin is warm and dry.  Neurological:     Mental Status: He is alert.   Results for orders placed or performed in visit on 01/15/19  Novel Coronavirus, NAA ([facility])   Specimen: Oropharyngeal(OP) collection in vial transport medium   OROPHARYNGEA  TESTING  Result Value Ref Range   SARS-CoV-2, NAA Detected (A) Not Detected      Assessment & Plan:   Problem List Items Addressed This Visit   None Visit Diagnoses     Right foot pain    -  Primary   Relevant Medications   naproxen (NAPROSYN) 500 MG tablet   Other Relevant Orders   DG Foot Complete Right       Clinically with localized R mid foot dorsal pain, likely sprain injury from misstep 2 weeks ago acute inciting cause, also has overuse with foot on feet daily for work and driving with frequent pedal use with R foot No other trauma or other provoking injury other than stated above No fall No evidence of ankle sprain or other injury  Will check X-ray today to rule out fracture, however seems less likely mechanism Naproxen rx 2-4 weeks PRN Topical Voltaren PRN Tylenol PRN RICE therapy Handwritten rx Post-OP Shoe / CAM Henrietta Dine, can take to med supply store, goal is foot immobilization offloading for 2-4 weeks F/u as planned if need or can refer to ortho if not improving.    Orders Placed This Encounter  Procedures   DG Foot Complete Right    Standing Status:   Future     Standing Expiration Date:   03/21/2022    Order Specific Question:   Reason for Exam (SYMPTOM  OR DIAGNOSIS REQUIRED)    Answer:   R foot dorsal mid foot arch pain weight bearing, injured 2 weeks ago off a step, rule out fracture    Order Specific Question:   Preferred imaging location?    Answer:   [facility]     Meds ordered this encounter  Medications   naproxen (NAPROSYN) 500 MG tablet    Sig: Take 1 tablet (500 mg total) by mouth 2 (two) times daily with a meal. For 1-2 weeks then as needed    Dispense:  60 tablet    Refill:  1      Follow up plan: Return in about 4 weeks (around 04/18/2021), or if symptoms worsen or fail to improve, for R foot pain, 2-4 weeks if unresolved.   Saralyn Pilar, DO [facility] 03/21/2021, 11:27 AM

## 2021-03-21 NOTE — Patient Instructions (Addendum)
Thank you for coming to the office today.  X-ray today stay tuned for results.  Recommend CAM Walker Boot - to off load foot pressure, can take rx to supply store below or pharmacy. Or order / purchase your own boot without rx if needed.  Recommend trial of Anti-inflammatory with Naproxen (Naprosyn) 500mg  tabs - take one with food and plenty of water TWICE daily every day (breakfast and dinner), for next 1 to 2 weeks, then you may take only as needed - DO NOT TAKE any ibuprofen, aleve, motrin while you are taking this medicine - It is safe to take Tylenol Ext Str 500mg  tabs - take 1 to 2 (max dose 1000mg ) every 6 hours as needed for breakthrough pain, max 24 hour daily dose is 6 to 8 tablets or 4000mg   START anti inflammatory topical - OTC Voltaren (generic Diclofenac) topical 2-4 times a day as needed for pain swelling of affected joint for 1-2 weeks or longer.   [facility] [address] [place], [facility] Ph: [phone]  [facility]. [address] [place], [phone] [facility] Ph: [phone]  [facility] [address] [place], [phone] [facility] Open until 5PM Phone: [phone]  [facility] [address] [place], [phone] [facility] Ph [phone]  Please schedule a Follow-up Appointment to: Return in about 4 weeks (around 04/18/2021), or if symptoms worsen or fail to improve, for R foot pain, 2-4 weeks if unresolved.  If you have any other questions or concerns, please feel free to call the office or send a message through MyChart. You may also schedule an earlier appointment if necessary.  Additionally, you may be receiving a survey about your experience at our office within a few days to 1 week by e-mail or mail. We value your feedback.  [place], DO [facility], [phone]

## 2021-03-22 DIAGNOSIS — R931 Abnormal findings on diagnostic imaging of heart and coronary circulation: Secondary | ICD-10-CM | POA: Diagnosis not present

## 2021-03-22 DIAGNOSIS — I25119 Atherosclerotic heart disease of native coronary artery with unspecified angina pectoris: Secondary | ICD-10-CM | POA: Diagnosis not present

## 2021-03-22 DIAGNOSIS — I10 Essential (primary) hypertension: Secondary | ICD-10-CM | POA: Diagnosis not present

## 2021-03-22 DIAGNOSIS — R079 Chest pain, unspecified: Secondary | ICD-10-CM | POA: Diagnosis not present

## 2021-03-22 DIAGNOSIS — F419 Anxiety disorder, unspecified: Secondary | ICD-10-CM | POA: Diagnosis not present

## 2021-03-22 DIAGNOSIS — R0609 Other forms of dyspnea: Secondary | ICD-10-CM | POA: Diagnosis not present

## 2021-03-22 DIAGNOSIS — R911 Solitary pulmonary nodule: Secondary | ICD-10-CM | POA: Diagnosis not present

## 2021-05-19 ENCOUNTER — Other Ambulatory Visit: Payer: Self-pay | Admitting: Family Medicine

## 2021-05-19 DIAGNOSIS — B001 Herpesviral vesicular dermatitis: Secondary | ICD-10-CM

## 2021-05-20 NOTE — Telephone Encounter (Signed)
Requested medication (s) are due for refill today: yes  Requested medication (s) are on the active medication list: yes  Last refill:  11/26/19  Future visit scheduled: no  Notes to clinic:  Failed protocol due to no valid visit within 12  months, please assess.   Requested Prescriptions  Pending Prescriptions Disp Refills   valACYclovir (VALTREX) 1000 MG tablet [Pharmacy Med Name: VALACYCLOVIR HCL 1 GRAM TABLET] 20 tablet 1    Sig: For 1 day treatment fever blister can take 2 pills twice a day for 1 day. If preferred can take 1 pill twice a day for 7 days instead, only as needed.     Antimicrobials:  Antiviral Agents - Anti-Herpetic Passed - 05/19/2021 11:05 AM      Passed - Valid encounter within last 12 months    Recent Outpatient Visits           2 months ago Right foot pain   [facility] [place], Netta Neat, DO   3 months ago GAD (generalized anxiety disorder)   [facility] Smitty Cords, DO   1 year ago Acute non-recurrent frontal sinusitis   [facility] Smitty Cords, DO   2 years ago Suspected Covid-19 Virus Infection   [facility] Smitty Cords, DO   2 years ago Essential hypertension   [facility] [place], Netta Neat, [place]

## 2021-06-12 ENCOUNTER — Other Ambulatory Visit: Payer: Self-pay | Admitting: Family Medicine

## 2021-06-12 DIAGNOSIS — B001 Herpesviral vesicular dermatitis: Secondary | ICD-10-CM

## 2021-06-12 NOTE — Telephone Encounter (Signed)
Requested Prescriptions  Pending Prescriptions Disp Refills   valACYclovir (VALTREX) 1000 MG tablet [Pharmacy Med Name: VALACYCLOVIR HCL 1 GRAM TABLET] 20 tablet 1    Sig: FOR 1 DAY TREATMENT FEVER BLISTER CAN TAKE 2 PILLS TWICE A DAY FOR 1 DAY. IF PREFERRED CAN TAKE 1 PILL TWICE A DAY FOR 7 DAYS INSTEAD, ONLY AS NEEDED.     Antimicrobials:  Antiviral Agents - Anti-Herpetic Passed - 06/12/2021  9:31 AM      Passed - Valid encounter within last 12 months    Recent Outpatient Visits          2 months ago Right foot pain   [place], DO   3 months ago GAD (generalized anxiety disorder)   [place], DO   1 year ago Acute non-recurrent frontal sinusitis   [place], DO   2 years ago Suspected Covid-19 Virus Infection   [facility] Olin Hauser, DO   2 years ago Essential hypertension   [place], Devonne Doughty, [place]

## 2021-07-03 ENCOUNTER — Other Ambulatory Visit: Payer: Self-pay | Admitting: Family Medicine

## 2021-07-03 DIAGNOSIS — R002 Palpitations: Secondary | ICD-10-CM

## 2021-07-03 DIAGNOSIS — F419 Anxiety disorder, unspecified: Secondary | ICD-10-CM

## 2021-07-03 DIAGNOSIS — I10 Essential (primary) hypertension: Secondary | ICD-10-CM

## 2021-07-03 NOTE — Telephone Encounter (Signed)
Requested Prescriptions  Pending Prescriptions Disp Refills   metoprolol tartrate (LOPRESSOR) 25 MG tablet [Pharmacy Med Name: METOPROLOL TARTRATE 25 MG TAB] 90 tablet 0    Sig: TAKE 1/2 TABLET BY MOUTH TWICE A DAY     Cardiovascular:  Beta Blockers Passed - 07/03/2021 12:51 AM      Passed - Last BP in normal range    BP Readings from Last 1 Encounters:  03/21/21 134/85         Passed - Last Heart Rate in normal range    Pulse Readings from Last 1 Encounters:  03/21/21 60         Passed - Valid encounter within last 6 months    Recent Outpatient Visits          3 months ago Right foot pain   [facility], Netta Neat, DO   4 months ago GAD (generalized anxiety disorder)   [facility] Smitty Cords, DO   1 year ago Acute non-recurrent frontal sinusitis   [facility] Smitty [facility], DO   2 years ago Suspected Covid-19 Virus Infection   [facility] [place], Netta Neat, DO   3 years ago Essential hypertension   [facility] [place], Netta Neat, [place]

## 2021-08-05 ENCOUNTER — Other Ambulatory Visit: Payer: Self-pay | Admitting: Family Medicine

## 2021-08-05 DIAGNOSIS — F419 Anxiety disorder, unspecified: Secondary | ICD-10-CM

## 2021-08-05 DIAGNOSIS — I10 Essential (primary) hypertension: Secondary | ICD-10-CM

## 2021-08-05 DIAGNOSIS — R002 Palpitations: Secondary | ICD-10-CM

## 2021-08-05 NOTE — Telephone Encounter (Signed)
Call to patient- left message to call back- next fill is due 4/29- patient has lost Rx-left behind on trip- advised not sure insurance will pay for Rx- does he just want enough pills until fill is due- may be less expensive if has to pay out of pocket ?

## 2021-08-05 NOTE — Telephone Encounter (Signed)
Copied from CRM [id number]. Topic: Quick Communication - Rx Refill/Question ?>> Aug 05, 2021 11:15 AM Todd Crawford wrote: ?Medication: metoprolol tartrate (LOPRESSOR) 25 MG tablet [[id number]]  ? ?Has the patient contacted their pharmacy? Yes.  The patient has been directed to contact their PCP ?(Agent: If no, request that the patient contact the pharmacy for the refill. If patient does not wish to contact the pharmacy document the reason why and proceed with request.) ?(Agent: If yes, when and what did the pharmacy advise?) ? ?Preferred Pharmacy (with phone number or street name): [id number] [place] [place] - [address] ?[address] [place] [address] ?Phone: [phone] Fax: [phone] ?Hours: Not open 24 hours ? ? ?Has the patient been seen for an appointment in the last year OR does the patient have an upcoming appointment? Yes.   ? ?Agent: Please be advised that RX refills may take up to 3 business days. We ask that you follow-up with your pharmacy. ?

## 2021-08-05 NOTE — Telephone Encounter (Signed)
Waiting for patient response to earlier question regarding lost Rx- duplicate request- will go ahead and fill the 90 on other request since patient has called again.  ?Requested Prescriptions  ?Pending Prescriptions Disp Refills  ?? metoprolol tartrate (LOPRESSOR) 25 MG tablet 90 tablet 0  ?  Sig: Take 0.5 tablets (12.5 mg total) by mouth 2 (two) times daily.  ?  ? Cardiovascular:  Beta Blockers Passed - 08/05/2021 11:48 AM  ?  ?  Passed - Last BP in normal range  ?  BP Readings from Last 1 Encounters:  ?03/21/21 134/85  ?   ?  ?  Passed - Last Heart Rate in normal range  ?  Pulse Readings from Last 1 Encounters:  ?03/21/21 60  ?   ?  ?  Passed - Valid encounter within last 6 months  ?  Recent Outpatient Visits   ?      ? 4 months ago Right foot pain  ? [facility] [place], Netta Neat, DO  ? 5 months ago GAD (generalized anxiety disorder)  ? [facility] [place], Netta Neat, DO  ? 1 year ago Acute non-recurrent frontal sinusitis  ? [facility] [place], Netta Neat, DO  ? 2 years ago Suspected Covid-19 Virus Infection  ? [facility] [place], Netta Neat, DO  ? 3 years ago Essential hypertension  ? [facility] Smitty Cords, DO  ?  ?  ? ?  ?  ?  ? ? ?

## 2021-08-05 NOTE — Telephone Encounter (Signed)
Have received duplicate request with no response to call- will fill #90 ?Requested Prescriptions  ?Pending Prescriptions Disp Refills  ?? metoprolol tartrate (LOPRESSOR) 25 MG tablet [Pharmacy Med Name: METOPROLOL TARTRATE 25 MG TAB] 90 tablet 0  ?  Sig: TAKE 1/2 TABLET BY MOUTH TWICE A DAY  ?  ? Cardiovascular:  Beta Blockers Passed - 08/05/2021 10:26 AM  ?  ?  Passed - Last BP in normal range  ?  BP Readings from Last 1 Encounters:  ?03/21/21 134/85  ?   ?  ?  Passed - Last Heart Rate in normal range  ?  Pulse Readings from Last 1 Encounters:  ?03/21/21 60  ?   ?  ?  Passed - Valid encounter within last 6 months  ?  Recent Outpatient Visits   ?      ? 4 months ago Right foot pain  ? [facility] [place], Netta Neat, DO  ? 5 months ago GAD (generalized anxiety disorder)  ? [facility] [place], Netta Neat, DO  ? 1 year ago Acute non-recurrent frontal sinusitis  ? [facility] [place], Netta Neat, DO  ? 2 years ago Suspected Covid-19 Virus Infection  ? [facility] [place], Netta Neat, DO  ? 3 years ago Essential hypertension  ? [facility] Smitty Cords, DO  ?  ?  ? ?  ?  ?  ? ?

## 2021-08-15 ENCOUNTER — Other Ambulatory Visit: Payer: Self-pay | Admitting: Family Medicine

## 2021-08-15 DIAGNOSIS — F411 Generalized anxiety disorder: Secondary | ICD-10-CM

## 2021-08-15 NOTE — Telephone Encounter (Signed)
Requested Prescriptions  ?Pending Prescriptions Disp Refills  ?? escitalopram (LEXAPRO) 10 MG tablet [Pharmacy Med Name: ESCITALOPRAM 10 MG TABLET] 90 tablet 1  ?  Sig: TAKE 1 TABLET BY MOUTH EVERY DAY  ?  ? Psychiatry:  Antidepressants - SSRI Passed - 08/15/2021  1:49 AM  ?  ?  Passed - Valid encounter within last 6 months  ?  Recent Outpatient Visits   ?      ? 4 months ago Right foot pain  ? [facility] [place], Netta Neat, DO  ? 5 months ago GAD (generalized anxiety disorder)  ? [facility] [place], Netta Neat, DO  ? 1 year ago Acute non-recurrent frontal sinusitis  ? [facility] [place], Netta Neat, DO  ? 2 years ago Suspected Covid-19 Virus Infection  ? [facility] [place], Netta Neat, DO  ? 3 years ago Essential hypertension  ? [facility] Smitty Cords, DO  ?  ?  ? ?  ?  ?  ? ? ?

## 2021-11-05 ENCOUNTER — Other Ambulatory Visit: Payer: Self-pay | Admitting: Family Medicine

## 2021-11-05 DIAGNOSIS — F419 Anxiety disorder, unspecified: Secondary | ICD-10-CM

## 2021-11-05 DIAGNOSIS — R002 Palpitations: Secondary | ICD-10-CM

## 2021-11-05 DIAGNOSIS — I10 Essential (primary) hypertension: Secondary | ICD-10-CM

## 2021-11-07 NOTE — Telephone Encounter (Signed)
Courtesy refill.Patient will need an office visit for further refills. Requested Prescriptions  Pending Prescriptions Disp Refills  . metoprolol tartrate (LOPRESSOR) 25 MG tablet [Pharmacy Med Name: METOPROLOL TARTRATE 25 MG TAB] 30 tablet 0    Sig: TAKE 1/2 TABLET BY MOUTH TWICE A DAY     Cardiovascular:  Beta Blockers Failed - 11/05/2021  1:28 AM      Failed - Valid encounter within last 6 months    Recent Outpatient Visits          7 months ago Right foot pain   [facility] [place], Netta Neat, DO   8 months ago GAD (generalized anxiety disorder)   [facility] Smitty Cords, DO   1 year ago Acute non-recurrent frontal sinusitis   [facility] Smitty Cords, DO   2 years ago Suspected Covid-19 Virus Infection   [facility] [place], Netta Neat, DO   3 years ago Essential hypertension   [facility] Smitty Cords, DO             Passed - Last BP in normal range    BP Readings from Last 1 Encounters:  03/21/21 134/85         Passed - Last Heart Rate in normal range    Pulse Readings from Last 1 Encounters:  03/21/21 60

## 2021-11-07 NOTE — Telephone Encounter (Signed)
Called pt and LMOM to return call and schedule an OV.

## 2021-11-21 ENCOUNTER — Other Ambulatory Visit: Payer: Self-pay | Admitting: Family Medicine

## 2021-11-21 DIAGNOSIS — F419 Anxiety disorder, unspecified: Secondary | ICD-10-CM

## 2021-11-21 DIAGNOSIS — R002 Palpitations: Secondary | ICD-10-CM

## 2021-11-21 DIAGNOSIS — I10 Essential (primary) hypertension: Secondary | ICD-10-CM

## 2021-11-22 NOTE — Telephone Encounter (Signed)
Requested medications are due for refill today.  Too soon  Requested medications are on the active medications list.  yes  Last refill. 11/07/2021 #30 0 refills  Future visit scheduled.   no  Notes to clinic.  PT is due for OV. Courtesy refill given.    Requested Prescriptions  Pending Prescriptions Disp Refills   metoprolol tartrate (LOPRESSOR) 25 MG tablet [Pharmacy Med Name: METOPROLOL TARTRATE 25 MG TAB] 90 tablet 1    Sig: TAKE 1/2 TABLET TWICE A DAY BY MOUTH     Cardiovascular:  Beta Blockers Failed - 11/21/2021 11:41 AM      Failed - Valid encounter within last 6 months    Recent Outpatient Visits           8 months ago Right foot pain   [facility] Smitty Cords, DO   9 months ago GAD (generalized anxiety disorder)   [place] Smitty Cords, DO   1 year ago Acute non-recurrent frontal sinusitis   [facility] Smitty Cords, DO   2 years ago Suspected Covid-19 Virus Infection   [facility] [place], Netta Neat, DO   3 years ago Essential hypertension   [facility] Smitty Cords, DO              Passed - Last BP in normal range    BP Readings from Last 1 Encounters:  03/21/21 134/85         Passed - Last Heart Rate in normal range    Pulse Readings from Last 1 Encounters:  03/21/21 60

## 2021-11-22 NOTE — Telephone Encounter (Signed)
Called pt LMOM to return call to make an OV appointment.

## 2022-03-13 ENCOUNTER — Other Ambulatory Visit: Payer: Self-pay | Admitting: Family Medicine

## 2022-03-13 DIAGNOSIS — F411 Generalized anxiety disorder: Secondary | ICD-10-CM

## 2022-03-14 NOTE — Telephone Encounter (Signed)
Requested medication (s) are due for refill today: yes  Requested medication (s) are on the active medication list: yes  Last refill:  02/17/21 #30 2 RF  Future visit scheduled: no  Notes to clinic:  no results for Cr   Requested Prescriptions  Pending Prescriptions Disp Refills   hydrOXYzine (ATARAX) 10 MG tablet [Pharmacy Med Name: HYDROXYZINE HCL 10 MG TABLET] 30 tablet 2    Sig: Take 1-2 tablets (10-20 mg total) by mouth every 6 (six) hours as needed for anxiety.     Ear, Nose, and Throat:  Antihistamines 2 Failed - 03/13/2022 11:08 AM      Failed - Cr in normal range and within 360 days    No results found for: "CREATININE", "LABCREAU", "LABCREA", "POCCRE"       Passed - Valid encounter within last 12 months    Recent Outpatient Visits           11 months ago Right foot pain   [place], DO   1 year ago GAD (generalized anxiety disorder)   [facility] Olin Hauser[facility]   2 years ago Acute non-recurrent frontal sinusitis   [place], DO   3 years ago Suspected Covid-19 Virus Infection   [facility] Olin Hauser, DO   3 years ago Essential hypertension   [place], Devonne Doughty, DO

## 2022-09-13 IMAGING — DX DG FOOT COMPLETE 3+V*R*
3 series · 3 of 3 positions shown · non-contrast
Comparison: None

CLINICAL DATA: Injury to the RIGHT foot 2 weeks ago.

EXAM:
RIGHT FOOT COMPLETE - 3+ VIEW

[foot ap]
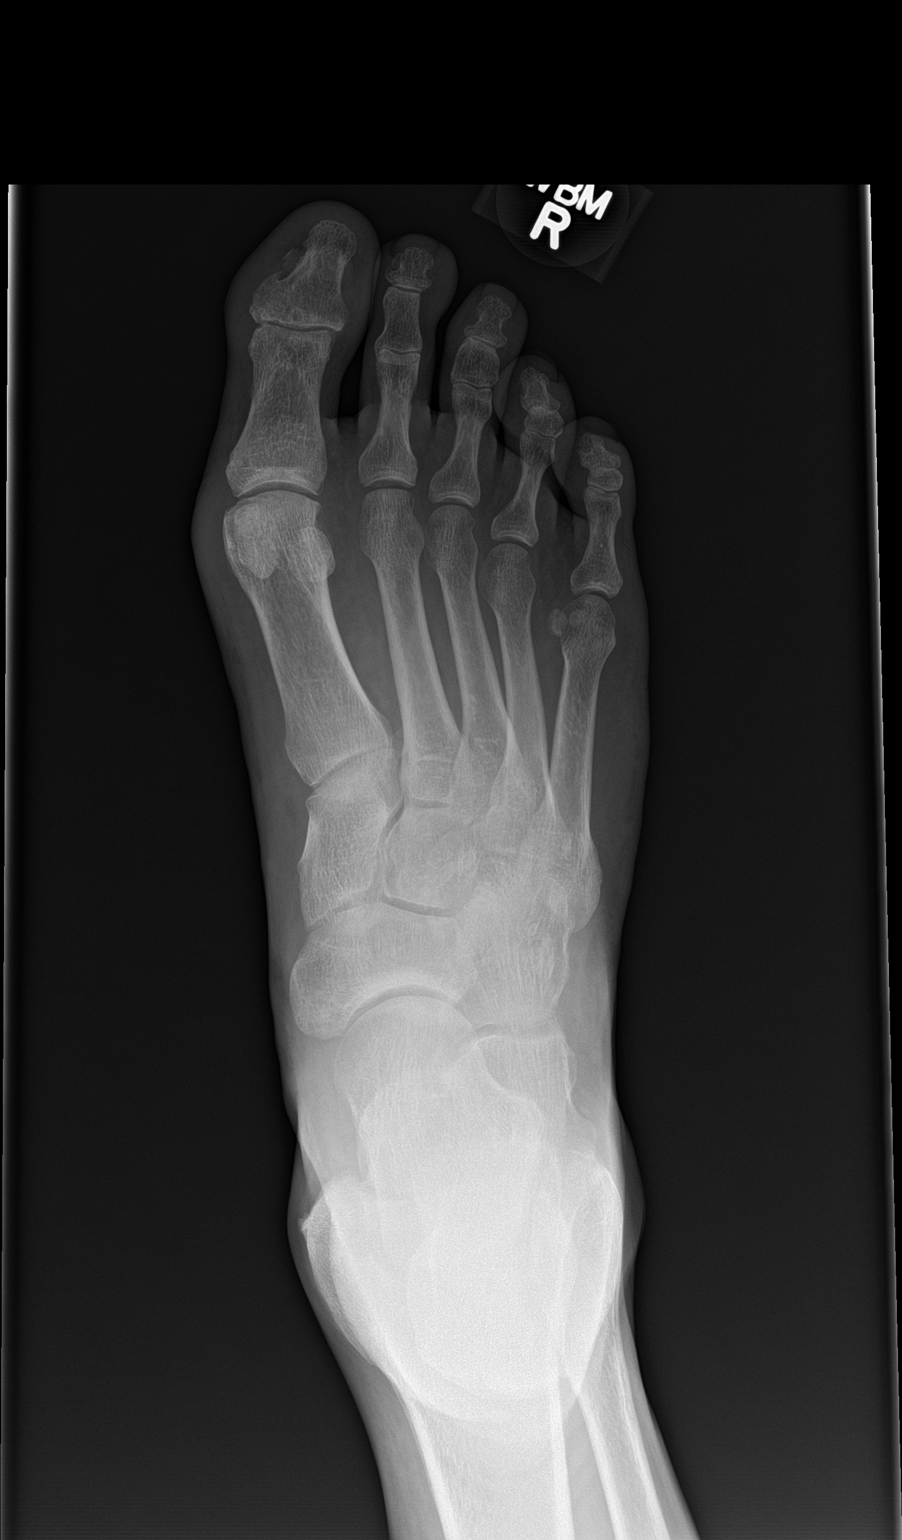

[foot obl]
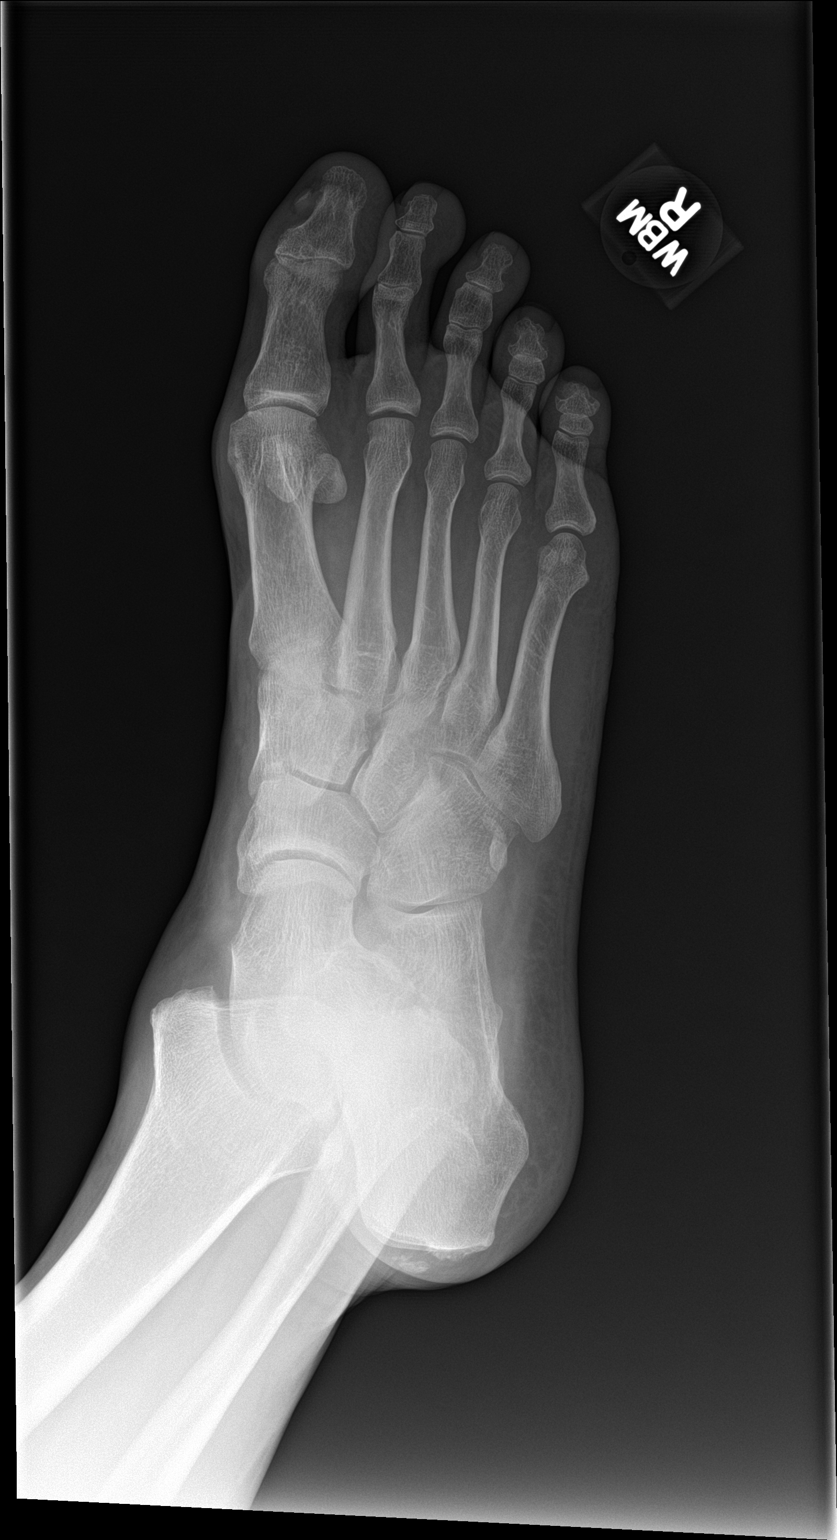

[foot lat]
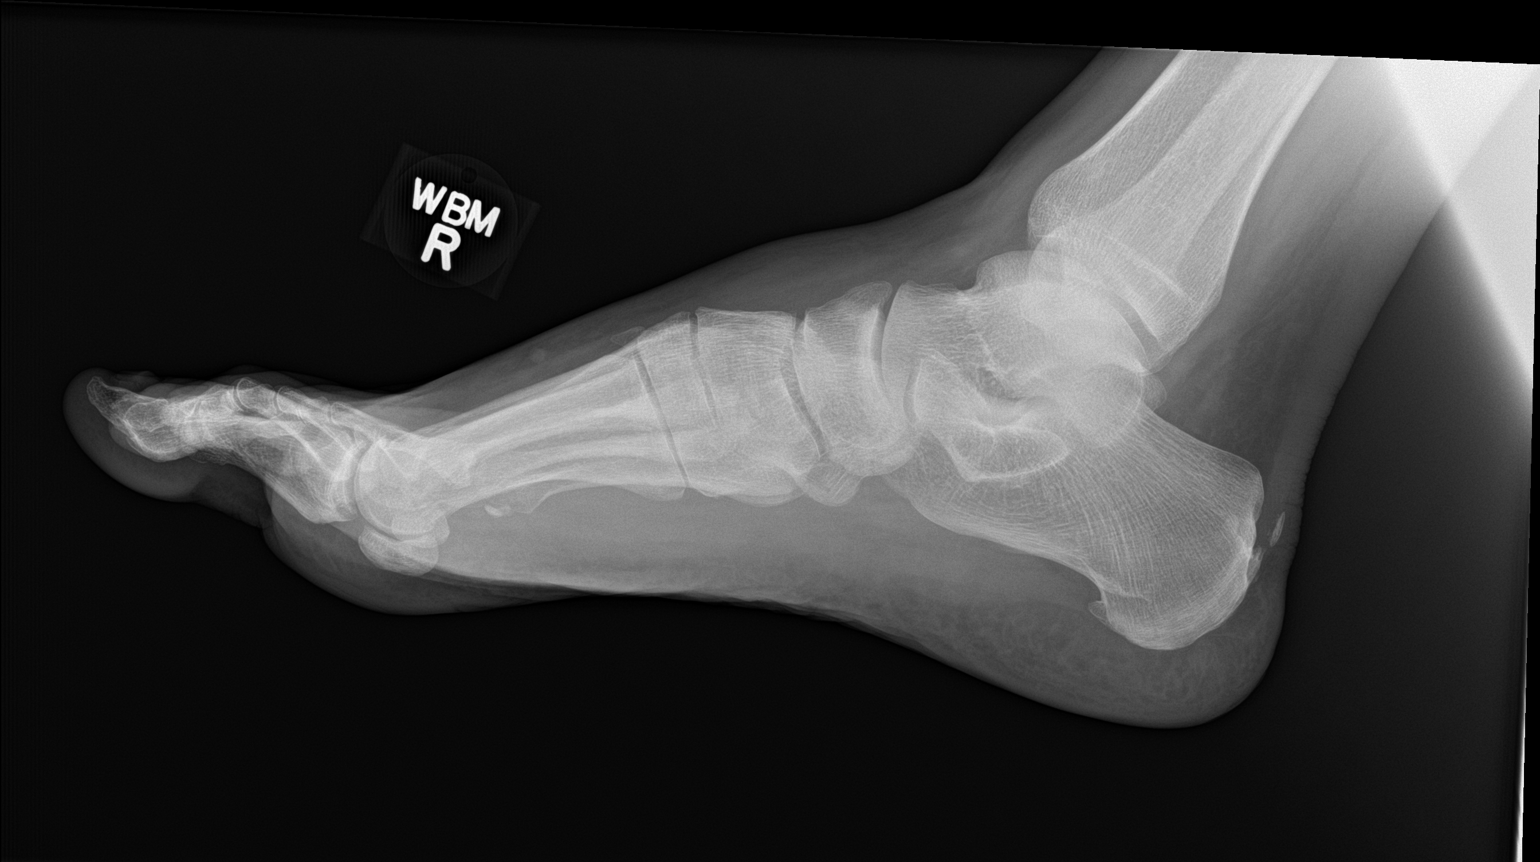

[3 of 3 positions shown; findings below may reference images not displayed]

FINDINGS: Soft tissue swelling over the dorsum of the foot, centered more
about the midfoot than the forefoot. No visible fracture though
there is some mild irregularity at the base of the first metatarsal
and medial cuneiform, no substantial widening. Mild hallux valgus
deformity. No visible gross malalignment in the area of the midfoot.

Signs of plantar and Achilles enthesopathy.
IMPRESSION: Soft tissue swelling without visible fracture. Difficult to exclude
subtle Lisfranc injury in the setting of trauma and midfoot pain.
Correlate with any point tenderness in this area. Weighted views may
be helpful if there is concern for this type of injury.

Well corticated appearing bony irregularity at the base of the first
metatarsal is of uncertain significance though could potentially
represent sequela of injury versus is os intermetatarseum.

Mild hallux valgus deformity.
